# Patient Record
Sex: Female | Born: 1960 | Race: Black or African American | Hispanic: Yes | Marital: Single | Smoking: Former smoker
Health system: Southern US, Community
[De-identification: ages and names within clinical notes are randomized; demographics above are authoritative.]

## PROBLEM LIST (undated history)

## (undated) DIAGNOSIS — E119 Type 2 diabetes mellitus without complications: Secondary | ICD-10-CM

## (undated) DIAGNOSIS — I639 Cerebral infarction, unspecified: Secondary | ICD-10-CM

---

## 2020-07-16 ENCOUNTER — Emergency Department (HOSPITAL_COMMUNITY): Payer: Medicare PPO

## 2020-07-16 ENCOUNTER — Inpatient Hospital Stay (HOSPITAL_COMMUNITY)
Admission: EM | Admit: 2020-07-16 | Discharge: 2020-07-21 | DRG: 637 | Disposition: A | Discharge status: Home / Self Care | Payer: Medicare PPO | Attending: Internal Medicine | Admitting: Internal Medicine

## 2020-07-16 ENCOUNTER — Encounter (HOSPITAL_COMMUNITY): Payer: Self-pay

## 2020-07-16 DIAGNOSIS — E1110 Type 2 diabetes mellitus with ketoacidosis without coma: Secondary | ICD-10-CM | POA: Diagnosis not present

## 2020-07-16 DIAGNOSIS — I129 Hypertensive chronic kidney disease with stage 1 through stage 4 chronic kidney disease, or unspecified chronic kidney disease: Secondary | ICD-10-CM | POA: Diagnosis present

## 2020-07-16 DIAGNOSIS — N1832 Chronic kidney disease, stage 3b: Secondary | ICD-10-CM | POA: Diagnosis present

## 2020-07-16 DIAGNOSIS — K5900 Constipation, unspecified: Secondary | ICD-10-CM | POA: Diagnosis present

## 2020-07-16 DIAGNOSIS — Z20822 Contact with and (suspected) exposure to covid-19: Secondary | ICD-10-CM | POA: Diagnosis present

## 2020-07-16 DIAGNOSIS — E1122 Type 2 diabetes mellitus with diabetic chronic kidney disease: Secondary | ICD-10-CM | POA: Diagnosis present

## 2020-07-16 DIAGNOSIS — E871 Hypo-osmolality and hyponatremia: Secondary | ICD-10-CM | POA: Diagnosis present

## 2020-07-16 DIAGNOSIS — Z888 Allergy status to other drugs, medicaments and biological substances status: Secondary | ICD-10-CM

## 2020-07-16 DIAGNOSIS — E1100 Type 2 diabetes mellitus with hyperosmolarity without nonketotic hyperglycemic-hyperosmolar coma (NKHHC): Secondary | ICD-10-CM | POA: Diagnosis present

## 2020-07-16 DIAGNOSIS — E876 Hypokalemia: Secondary | ICD-10-CM | POA: Diagnosis present

## 2020-07-16 DIAGNOSIS — Z8673 Personal history of transient ischemic attack (TIA), and cerebral infarction without residual deficits: Secondary | ICD-10-CM | POA: Diagnosis not present

## 2020-07-16 DIAGNOSIS — G9341 Metabolic encephalopathy: Secondary | ICD-10-CM | POA: Diagnosis present

## 2020-07-16 DIAGNOSIS — Z885 Allergy status to narcotic agent status: Secondary | ICD-10-CM

## 2020-07-16 DIAGNOSIS — Z79899 Other long term (current) drug therapy: Secondary | ICD-10-CM | POA: Diagnosis not present

## 2020-07-16 DIAGNOSIS — N179 Acute kidney failure, unspecified: Secondary | ICD-10-CM | POA: Diagnosis present

## 2020-07-16 DIAGNOSIS — R4781 Slurred speech: Secondary | ICD-10-CM | POA: Diagnosis present

## 2020-07-16 DIAGNOSIS — Z7984 Long term (current) use of oral hypoglycemic drugs: Secondary | ICD-10-CM | POA: Diagnosis not present

## 2020-07-16 DIAGNOSIS — E872 Acidosis, unspecified: Secondary | ICD-10-CM | POA: Diagnosis present

## 2020-07-16 DIAGNOSIS — Z794 Long term (current) use of insulin: Secondary | ICD-10-CM | POA: Diagnosis not present

## 2020-07-16 DIAGNOSIS — E860 Dehydration: Secondary | ICD-10-CM | POA: Diagnosis present

## 2020-07-16 DIAGNOSIS — E1165 Type 2 diabetes mellitus with hyperglycemia: Secondary | ICD-10-CM

## 2020-07-16 DIAGNOSIS — Z9114 Patient's other noncompliance with medication regimen: Secondary | ICD-10-CM | POA: Diagnosis not present

## 2020-07-16 DIAGNOSIS — Z880 Allergy status to penicillin: Secondary | ICD-10-CM

## 2020-07-16 HISTORY — DX: Type 2 diabetes mellitus without complications: E11.9

## 2020-07-16 HISTORY — DX: Cerebral infarction, unspecified: I63.9

## 2020-07-16 LAB — COMPREHENSIVE METABOLIC PANEL
ALT: 13 U/L (ref 0–44)
AST: 19 U/L (ref 15–41)
Albumin: 3.8 g/dL (ref 3.5–5.0)
Alkaline Phosphatase: 67 U/L (ref 38–126)
Anion gap: 20 — ABNORMAL HIGH (ref 5–15)
BUN: 60 mg/dL — ABNORMAL HIGH (ref 6–20)
CO2: 21 mmol/L — ABNORMAL LOW (ref 22–32)
Calcium: 9.4 mg/dL (ref 8.9–10.3)
Chloride: 79 mmol/L — ABNORMAL LOW (ref 98–111)
Creatinine, Ser: 2.87 mg/dL — ABNORMAL HIGH (ref 0.44–1.00)
GFR, Estimated: 18 mL/min — ABNORMAL LOW (ref >60)
Glucose, Bld: 901 mg/dL (ref 70–99)
Potassium: 4.1 mmol/L (ref 3.5–5.1)
Sodium: 120 mmol/L — ABNORMAL LOW (ref 135–145)
Total Bilirubin: 1.6 mg/dL — ABNORMAL HIGH (ref 0.3–1.2)
Total Protein: 7.5 g/dL (ref 6.5–8.1)

## 2020-07-16 LAB — CBG MONITORING, ED
Glucose-Capillary: 186 mg/dL — ABNORMAL HIGH (ref 70–99)
Glucose-Capillary: 209 mg/dL — ABNORMAL HIGH (ref 70–99)
Glucose-Capillary: 225 mg/dL — ABNORMAL HIGH (ref 70–99)
Glucose-Capillary: 271 mg/dL — ABNORMAL HIGH (ref 70–99)
Glucose-Capillary: 340 mg/dL — ABNORMAL HIGH (ref 70–99)
Glucose-Capillary: 435 mg/dL — ABNORMAL HIGH (ref 70–99)
Glucose-Capillary: 453 mg/dL — ABNORMAL HIGH (ref 70–99)
Glucose-Capillary: >600 mg/dL (ref 70–99)
Glucose-Capillary: >600 mg/dL (ref 70–99)

## 2020-07-16 LAB — BASIC METABOLIC PANEL
Anion gap: 21 — ABNORMAL HIGH (ref 5–15)
Anion gap: 15 (ref 5–15)
Anion gap: 14 (ref 5–15)
BUN: 57 mg/dL — ABNORMAL HIGH (ref 6–20)
BUN: 51 mg/dL — ABNORMAL HIGH (ref 6–20)
BUN: 47 mg/dL — ABNORMAL HIGH (ref 6–20)
CO2: 21 mmol/L — ABNORMAL LOW (ref 22–32)
CO2: 23 mmol/L (ref 22–32)
CO2: 22 mmol/L (ref 22–32)
Calcium: 9.4 mg/dL (ref 8.9–10.3)
Calcium: 9.5 mg/dL (ref 8.9–10.3)
Calcium: 9.3 mg/dL (ref 8.9–10.3)
Chloride: 74 mmol/L — ABNORMAL LOW (ref 98–111)
Chloride: 89 mmol/L — ABNORMAL LOW (ref 98–111)
Chloride: 89 mmol/L — ABNORMAL LOW (ref 98–111)
Creatinine, Ser: 3 mg/dL — ABNORMAL HIGH (ref 0.44–1.00)
Creatinine, Ser: 2.19 mg/dL — ABNORMAL HIGH (ref 0.44–1.00)
Creatinine, Ser: 1.73 mg/dL — ABNORMAL HIGH (ref 0.44–1.00)
GFR, Estimated: 17 mL/min — ABNORMAL LOW (ref >60)
GFR, Estimated: 25 mL/min — ABNORMAL LOW (ref >60)
GFR, Estimated: 33 mL/min — ABNORMAL LOW (ref >60)
Glucose, Bld: 1010 mg/dL (ref 70–99)
Glucose, Bld: 490 mg/dL — ABNORMAL HIGH (ref 70–99)
Glucose, Bld: 244 mg/dL — ABNORMAL HIGH (ref 70–99)
Potassium: 4.6 mmol/L (ref 3.5–5.1)
Potassium: 4 mmol/L (ref 3.5–5.1)
Potassium: 3.1 mmol/L — ABNORMAL LOW (ref 3.5–5.1)
Sodium: 116 mmol/L — CL (ref 135–145)
Sodium: 127 mmol/L — ABNORMAL LOW (ref 135–145)
Sodium: 125 mmol/L — ABNORMAL LOW (ref 135–145)

## 2020-07-16 LAB — I-STAT VENOUS BLOOD GAS, ED
Acid-base deficit: 3 mmol/L — ABNORMAL HIGH (ref 0.0–2.0)
Bicarbonate: 23.1 mmol/L (ref 20.0–28.0)
Calcium, Ion: 1.07 mmol/L — ABNORMAL LOW (ref 1.15–1.40)
HCT: 44 % (ref 36.0–46.0)
Hemoglobin: 15 g/dL (ref 12.0–15.0)
O2 Saturation: 67 %
Potassium: 4.6 mmol/L (ref 3.5–5.1)
Sodium: 117 mmol/L — CL (ref 135–145)
TCO2: 24 mmol/L (ref 22–32)
pCO2, Ven: 42.3 mmHg — ABNORMAL LOW (ref 44.0–60.0)
pH, Ven: 7.344 (ref 7.250–7.430)
pO2, Ven: 37 mmHg (ref 32.0–45.0)

## 2020-07-16 LAB — LIPASE, BLOOD: Lipase: 132 U/L — ABNORMAL HIGH (ref 11–51)

## 2020-07-16 LAB — OSMOLALITY: Osmolality: 342 mOsm/kg (ref 275–295)

## 2020-07-16 LAB — RESP PANEL BY RT-PCR (FLU A&B, COVID) ARPGX2
Influenza A by PCR: NEGATIVE
Influenza B by PCR: NEGATIVE
SARS Coronavirus 2 by RT PCR: NEGATIVE

## 2020-07-16 LAB — CBC
HCT: 40.5 % (ref 36.0–46.0)
Hemoglobin: 13.3 g/dL (ref 12.0–15.0)
MCH: 28.3 pg (ref 26.0–34.0)
MCHC: 32.8 g/dL (ref 30.0–36.0)
MCV: 86.2 fL (ref 80.0–100.0)
Platelets: 248 10*3/uL (ref 150–400)
RBC: 4.7 MIL/uL (ref 3.87–5.11)
RDW: 11.6 % (ref 11.5–15.5)
WBC: 8.2 10*3/uL (ref 4.0–10.5)
nRBC: 0 % (ref 0.0–0.2)

## 2020-07-16 LAB — URINALYSIS, ROUTINE W REFLEX MICROSCOPIC
Bilirubin Urine: NEGATIVE
Glucose, UA: >=500 mg/dL — AB
Hgb urine dipstick: NEGATIVE
Ketones, ur: 5 mg/dL — AB
Leukocytes,Ua: NEGATIVE
Nitrite: NEGATIVE
Protein, ur: NEGATIVE mg/dL
Specific Gravity, Urine: 1.02 (ref 1.005–1.030)
pH: 5 (ref 5.0–8.0)

## 2020-07-16 LAB — BETA-HYDROXYBUTYRIC ACID: Beta-Hydroxybutyric Acid: 4.7 mmol/L — ABNORMAL HIGH (ref 0.05–0.27)

## 2020-07-16 LAB — MAGNESIUM: Magnesium: 2.9 mg/dL — ABNORMAL HIGH (ref 1.7–2.4)

## 2020-07-16 MED ORDER — TOPIRAMATE 25 MG PO TABS
100.0000 mg | ORAL_TABLET | Freq: Two times a day (BID) | ORAL | Status: DC
  Administered 2020-07-16 – 2020-07-21 (×10): 100 mg via ORAL
  Filled 2020-07-16 (×10): qty 4

## 2020-07-16 MED ORDER — LACTATED RINGERS IV BOLUS
20.0000 mL/kg | Freq: Once | INTRAVENOUS | Status: AC
  Administered 2020-07-16: 1000 mL via INTRAVENOUS

## 2020-07-16 MED ORDER — METOCLOPRAMIDE HCL 5 MG/ML IJ SOLN
10.0000 mg | Freq: Once | INTRAMUSCULAR | Status: AC
  Administered 2020-07-16: 10 mg via INTRAVENOUS
  Filled 2020-07-16: qty 2

## 2020-07-16 MED ORDER — MONTELUKAST SODIUM 10 MG PO TABS
10.0000 mg | ORAL_TABLET | Freq: Every day | ORAL | Status: DC
  Administered 2020-07-16 – 2020-07-20 (×5): 10 mg via ORAL
  Filled 2020-07-16 (×5): qty 1

## 2020-07-16 MED ORDER — DICYCLOMINE HCL 10 MG PO CAPS
10.0000 mg | ORAL_CAPSULE | Freq: Four times a day (QID) | ORAL | Status: DC
  Administered 2020-07-16 – 2020-07-21 (×16): 10 mg via ORAL
  Filled 2020-07-16 (×16): qty 1

## 2020-07-16 MED ORDER — ACETAMINOPHEN 325 MG PO TABS
650.0000 mg | ORAL_TABLET | Freq: Four times a day (QID) | ORAL | Status: DC | PRN
  Administered 2020-07-17 – 2020-07-20 (×4): 650 mg via ORAL
  Filled 2020-07-16 (×4): qty 2

## 2020-07-16 MED ORDER — ACETAMINOPHEN 650 MG RE SUPP: 650.0000 mg | Freq: Four times a day (QID) | RECTAL | Status: DC | PRN

## 2020-07-16 MED ORDER — ALBUTEROL SULFATE (2.5 MG/3ML) 0.083% IN NEBU: 2.5000 mg | INHALATION_SOLUTION | Freq: Four times a day (QID) | RESPIRATORY_TRACT | Status: DC | PRN

## 2020-07-16 MED ORDER — INSULIN REGULAR(HUMAN) IN NACL 100-0.9 UT/100ML-% IV SOLN
INTRAVENOUS | Status: DC
Start: 2020-07-16 — End: 2020-07-17
  Administered 2020-07-16: 9 [IU]/h via INTRAVENOUS
  Administered 2020-07-17: 4.4 [IU]/h via INTRAVENOUS
  Filled 2020-07-16 (×2): qty 100

## 2020-07-16 MED ORDER — ONDANSETRON HCL 4 MG/2ML IJ SOLN
4.0000 mg | Freq: Four times a day (QID) | INTRAMUSCULAR | Status: DC | PRN
  Administered 2020-07-16 – 2020-07-18 (×2): 4 mg via INTRAVENOUS
  Filled 2020-07-16 (×2): qty 2

## 2020-07-16 MED ORDER — LACTATED RINGERS IV SOLN: INTRAVENOUS | Status: DC

## 2020-07-16 MED ORDER — METOCLOPRAMIDE HCL 5 MG PO TABS
10.0000 mg | ORAL_TABLET | Freq: Three times a day (TID) | ORAL | Status: DC | PRN
  Administered 2020-07-17 (×2): 10 mg via ORAL
  Filled 2020-07-16 (×2): qty 2

## 2020-07-16 MED ORDER — HEPARIN SODIUM (PORCINE) 5000 UNIT/ML IJ SOLN
5000.0000 [IU] | Freq: Three times a day (TID) | INTRAMUSCULAR | Status: DC
  Administered 2020-07-16 – 2020-07-21 (×12): 5000 [IU] via SUBCUTANEOUS
  Filled 2020-07-16 (×12): qty 1

## 2020-07-16 MED ORDER — SODIUM CHLORIDE 0.9% FLUSH
3.0000 mL | Freq: Two times a day (BID) | INTRAVENOUS | Status: DC
  Administered 2020-07-17 – 2020-07-21 (×8): 3 mL via INTRAVENOUS

## 2020-07-16 MED ORDER — POTASSIUM CHLORIDE 10 MEQ/100ML IV SOLN
10.0000 meq | INTRAVENOUS | Status: AC
  Administered 2020-07-16 (×2): 10 meq via INTRAVENOUS
  Filled 2020-07-16 (×2): qty 100

## 2020-07-16 MED ORDER — PANTOPRAZOLE SODIUM 40 MG IV SOLR
40.0000 mg | INTRAVENOUS | Status: DC
  Administered 2020-07-16 – 2020-07-18 (×3): 40 mg via INTRAVENOUS
  Filled 2020-07-16 (×3): qty 40

## 2020-07-16 MED ORDER — ROSUVASTATIN CALCIUM 20 MG PO TABS
20.0000 mg | ORAL_TABLET | Freq: Every day | ORAL | Status: DC
  Administered 2020-07-16 – 2020-07-20 (×5): 20 mg via ORAL
  Filled 2020-07-16 (×5): qty 1

## 2020-07-16 MED ORDER — FLUTICASONE PROPIONATE 50 MCG/ACT NA SUSP
1.0000 | Freq: Every day | NASAL | Status: DC
  Administered 2020-07-17 – 2020-07-21 (×5): 1 via NASAL
  Filled 2020-07-16: qty 16

## 2020-07-16 MED ORDER — DEXTROSE 50 % IV SOLN
0.0000 mL | INTRAVENOUS | Status: DC | PRN
  Filled 2020-07-16: qty 50

## 2020-07-16 MED ORDER — DEXTROSE IN LACTATED RINGERS 5 % IV SOLN: INTRAVENOUS | Status: DC

## 2020-07-16 NOTE — H&P (Addendum)
History and Physical    Beth Wilkinson QIH:[id number] DOB: April 23, 1960 DOA: 07/16/2020  Referring MD/NP/PA: Tilden Fossa, MD PCP: Pcp, No  Patient coming from: via EMS  Chief Complaint: Vomiting  I have personally briefly reviewed patient's old medical records in [facility]   HPI: Beth Wilkinson is a 60 y.o. female with medical history significant of diabetes mellitus type 2 and CVA who presents with complaints of vomiting.  She lives in [place], [place] and has been visiting a friend here in [place] for the last 3 weeks.  Over the last 3 days however she reports that she has had difficulty controlling her blood sugars.  She had ran out of some of her short acting insulin and she was unable to keep down some of her oral medications.  Reports associated symptoms of headache and nausea.  Emesis has been nonbloody in appearance. Her niece makes note the patient has issues with compliance with medications in the past and has previously gone missing for peers in time for which family does know where she has gone.  She previously has had a stroke previously in the past with complaints of slurred speech, but patient reports that those symptoms have resolved.  ED Course: Upon admission into the emergency department patient was seen to be afebrile, pulse 92-1 06, blood pressure 78/51-130 2/86 after IV fluids, and all other vital signs maintained.  Chest x-ray showed no acute abnormalities.   Labs initially significant for sodium 120, chloride 79, CO2 21, glucose 901, BUN 60, creatinine 2.87, glucose 901, anion gap 20, lipase 132, and total bilirubin 1.6.  CT scan of the brain, abdomen, and pelvis did not note any acute abnormalities repeat labs revealed sodium 116, CO2 21, BUN 57, creatinine 3,  glucose 1010, and anion gap 21.  Patient had been started on insulin drip per protocol with bolus of lactated Ringer's, given 20 mg of potassium chloride IV, Zofran, and Reglan 10 mg IV.  TRH called to  admit.  Review of Systems  Constitutional:  Positive for malaise/fatigue. Negative for fever.  Eyes:  Negative for photophobia and pain.  Respiratory:  Negative for cough and shortness of breath.   Cardiovascular:  Negative for chest pain and leg swelling.  Gastrointestinal:  Positive for abdominal pain, nausea and vomiting.  Genitourinary:  Negative for dysuria and flank pain.  Musculoskeletal:  Negative for joint pain and myalgias.  Neurological:  Positive for speech change. Negative for loss of consciousness.  Psychiatric/Behavioral:  Negative for substance abuse.    Past Medical History:  Diagnosis Date   Diabetes mellitus type 2 in nonobese [facility])     History reviewed. No pertinent surgical history.   has no history on file for tobacco use, alcohol use, and drug use.  Allergies  Allergen Reactions   Codeine Other (See Comments)    hallucinations   Penicillins Other (See Comments)   Pregabalin     No family history on file.  Prior to Admission medications   Medication Sig Start Date End Date Taking? Authorizing Provider  dicyclomine (BENTYL) 10 MG capsule Take 10 mg by mouth 4 (four) times daily. 02/20/20  Yes [provider]  fluticasone (FLONASE) 50 MCG/ACT nasal spray Place 1 spray into both nostrils daily. 04/15/20  Yes [provider]  gabapentin (NEURONTIN) 100 MG capsule Take 100 mg by mouth 3 (three) times daily. 05/30/20  Yes [provider]  glimepiride (AMARYL) 2 MG tablet Take 2 mg by mouth every morning. 06/17/20  Yes  [provider]  hydrOXYzine (VISTARIL) 25 MG capsule Take 25-50 mg by mouth at bedtime. 03/23/20  Yes [provider]  Insulin Lispro Prot & Lispro (HUMALOG 75/25 MIX) (75-25) 100 UNIT/ML Kwikpen Inject 15 Units into the skin daily.   Yes [provider]  JANUVIA 100 MG tablet Take 100 mg by mouth daily. 04/08/20  Yes [provider]  LANTUS 100 UNIT/ML injection Inject 40 Units into the skin  2 (two) times daily. 05/30/20  Yes [provider]  losartan-hydrochlorothiazide (HYZAAR) 100-25 MG tablet Take 1 tablet by mouth daily. 07/05/20  Yes [provider]  metoCLOPramide (REGLAN) 10 MG tablet Take 10 mg by mouth 3 (three) times daily as needed for nausea or vomiting. 05/30/20  Yes [provider]  metoprolol tartrate (LOPRESSOR) 25 MG tablet Take 25 mg by mouth 2 (two) times daily. 04/20/20  Yes [provider]  mirtazapine (REMERON SOL-TAB) 30 MG disintegrating tablet Take 30 mg by mouth at bedtime. 04/09/20  Yes [provider]  montelukast (SINGULAIR) 10 MG tablet Take 10 mg by mouth at bedtime. 06/17/20  Yes [provider]  nitroGLYCERIN (NITROSTAT) 0.4 MG SL tablet Place 0.4 mg under the tongue every 5 (five) minutes as needed for chest pain.   Yes [provider]  omeprazole (PRILOSEC) 40 MG capsule Take 40 mg by mouth daily. 02/19/20  Yes [provider]  pioglitazone (ACTOS) 30 MG tablet Take 30 mg by mouth daily. 06/17/20  Yes [provider]  PROAIR RESPICLICK 108 (90 Base) MCG/ACT AEPB Inhale 2 puffs into the lungs every 6 (six) hours as needed for wheezing or shortness of breath. 05/30/20  Yes [provider]  rosuvastatin (CRESTOR) 20 MG tablet Take 20 mg by mouth at bedtime. 07/05/20  Yes [provider]  topiramate (TOPAMAX) 100 MG tablet Take 100 mg by mouth 2 (two) times daily. 05/04/20  Yes [provider]    Physical Exam:  Constitutional: Elderly female NAD, calm, comfortable Vitals:   07/16/20 1239 07/16/20 1245 07/16/20 1300 07/16/20 1315  BP:  126/74 114/62 124/80  Pulse:  (!) 106 92 95  Resp:  15 19 17   Temp:      SpO2:  100% 99% 100%  Weight: 70.8 kg     Height: 5\' 4"  (1.626 m)      Eyes: PERRL, lids and conjunctivae normal ENMT: Mucous membranes are dry. Posterior pharynx clear of any exudate or lesions.  Neck: normal, supple, no masses, no  thyromegaly Respiratory: clear to auscultation bilaterally, no wheezing, no crackles. Normal respiratory effort. No accessory muscle use.  Cardiovascular: Tachycardic, no murmurs / rubs / gallops. No extremity edema. 2+ pedal pulses. No carotid bruits.  Abdomen: no tenderness, no masses palpated. No hepatosplenomegaly. Bowel sounds positive.  Musculoskeletal: no clubbing / cyanosis. No joint deformity upper and lower extremities. Good ROM, no contractures. Normal muscle tone.  Skin: no rashes, lesions, ulcers. No induration.  Poor skin turgor.   Neurologic: CN 2-12 grossly intact.  Slurred speech.  Patient able to move all extremities. Psychiatric:  Alert and oriented x 3. Normal mood.     Labs on Admission: I have personally reviewed following labs and imaging studies  CBC: Recent Labs  Lab 07/16/20 0909 07/16/20 1238  WBC 8.2  --   HGB 13.3 15.0  HCT 40.5 44.0  MCV 86.2  --   PLT 248  --    Basic Metabolic Panel: Recent Labs  Lab 07/16/20 0909 07/16/20 1231 07/16/20  1238  NA 120* 116* 117*  K 4.1 4.6 4.6  CL 79* 74*  --   CO2 21* 21*  --   GLUCOSE 901* 1,010*  --   BUN 60* 57*  --   CREATININE 2.87* 3.00*  --   CALCIUM 9.4 9.4  --   MG  --  2.9*  --    GFR: Estimated Creatinine Clearance: 19.2 mL/min (A) (by C-G formula based on SCr of 3 mg/dL (H)). Liver Function Tests: Recent Labs  Lab 07/16/20 0909  AST 19  ALT 13  ALKPHOS 67  BILITOT 1.6*  PROT 7.5  ALBUMIN 3.8   Recent Labs  Lab 07/16/20 0909  LIPASE 132*   No results for input(s): AMMONIA in the last 168 hours. Coagulation Profile: No results for input(s): INR, PROTIME in the last 168 hours. Cardiac Enzymes: No results for input(s): CKTOTAL, CKMB, CKMBINDEX, TROPONINI in the last 168 hours. BNP (last 3 results) No results for input(s): PROBNP in the last 8760 hours. HbA1C: No results for input(s): HGBA1C in the last 72 hours. CBG: Recent Labs  Lab 07/16/20 0913 07/16/20 1423  GLUCAP  >600* >600*   Lipid Profile: No results for input(s): CHOL, HDL, LDLCALC, TRIG, CHOLHDL, LDLDIRECT in the last 72 hours. Thyroid Function Tests: No results for input(s): TSH, T4TOTAL, FREET4, T3FREE, THYROIDAB in the last 72 hours. Anemia Panel: No results for input(s): VITAMINB12, FOLATE, FERRITIN, TIBC, IRON, RETICCTPCT in the last 72 hours. Urine analysis:    Component Value Date/Time   COLORURINE YELLOW 07/16/2020 1300   APPEARANCEUR HAZY (A) 07/16/2020 1300   LABSPEC 1.020 07/16/2020 1300   PHURINE 5.0 07/16/2020 1300   GLUCOSEU >=500 (A) 07/16/2020 1300   HGBUR NEGATIVE 07/16/2020 1300   BILIRUBINUR NEGATIVE 07/16/2020 1300   KETONESUR 5 (A) 07/16/2020 1300   PROTEINUR NEGATIVE 07/16/2020 1300   NITRITE NEGATIVE 07/16/2020 1300   LEUKOCYTESUR NEGATIVE 07/16/2020 1300   Sepsis Labs: Recent Results (from the past 240 hour(s))  Resp Panel by RT-PCR (Flu A&B, Covid) Nasopharyngeal Swab     Status: None   Collection Time: 07/16/20 12:27 PM   Specimen: Nasopharyngeal Swab; Nasopharyngeal(NP) swabs in vial transport medium  Result Value Ref Range Status   SARS Coronavirus 2 by RT PCR NEGATIVE NEGATIVE Final    Comment: (NOTE) SARS-CoV-2 target nucleic acids are NOT DETECTED.  The SARS-CoV-2 RNA is generally detectable in upper respiratory specimens during the acute phase of infection. The lowest concentration of SARS-CoV-2 viral copies this assay can detect is 138 copies/mL. A negative result does not preclude SARS-Cov-2 infection and should not be used as the sole basis for treatment or other patient management decisions. A negative result may occur with  improper specimen collection/handling, submission of specimen other than nasopharyngeal swab, presence of viral mutation(s) within the areas targeted by this assay, and inadequate number of viral copies(<138 copies/mL). A negative result must be combined with clinical observations, patient history, and  epidemiological information. The expected result is Negative.  Fact Sheet for Patients:  BloggerCourse.com  Fact Sheet for Healthcare Providers:  SeriousBroker.it  This test is no t yet approved or cleared by the [place] FDA and  has been authorized for detection and/or diagnosis of SARS-CoV-2 by FDA under an Emergency Use Authorization (EUA). This EUA will remain  in effect (meaning this test can be used) for the duration of the COVID-19 declaration under Section 564(b)(1) of the Act, 21 U.S.C.section 360bbb-3(b)(1), unless the authorization is terminated  or revoked sooner.  Influenza A by PCR NEGATIVE NEGATIVE Final   Influenza B by PCR NEGATIVE NEGATIVE Final    Comment: (NOTE) The Xpert Xpress SARS-CoV-2/FLU/RSV plus assay is intended as an aid in the diagnosis of influenza from Nasopharyngeal swab specimens and should not be used as a sole basis for treatment. Nasal washings and aspirates are unacceptable for Xpert Xpress SARS-CoV-2/FLU/RSV testing.  Fact Sheet for Patients: BloggerCourse.com  Fact Sheet for Healthcare Providers: SeriousBroker.it  This test is not yet approved or cleared by the [place] FDA and has been authorized for detection and/or diagnosis of SARS-CoV-2 by FDA under an Emergency Use Authorization (EUA). This EUA will remain in effect (meaning this test can be used) for the duration of the COVID-19 declaration under Section 564(b)(1) of the Act, 21 U.S.C. section 360bbb-3(b)(1), unless the authorization is terminated or revoked.  Performed at [facility], [address] [place], [place] [address]      Radiological Exams on Admission: CT Abdomen Pelvis Wo Contrast  Result Date: 07/16/2020 CLINICAL DATA:  Midline abdominal pain and nausea free sugar blood glucose greater than 1,000 EXAM: CT ABDOMEN AND PELVIS WITHOUT  CONTRAST TECHNIQUE: Multidetector CT imaging of the abdomen and pelvis was performed following the standard protocol without IV contrast. COMPARISON:  None. FINDINGS: Lower chest: Hypoventilatory change in the dependent lungs. No acute abnormality. Hepatobiliary: Unremarkable noncontrast appearance of the hepatic parenchyma. Gallbladder is unremarkable. No biliary ductal dilation. Pancreas: The pancreatic parenchyma is within normal noncontrast appearance. Spleen: Within normal limits. Adrenals/Urinary Tract: Thickening of the bilateral adrenal glands without discrete nodularity, favored represent hyperplasia. No hydronephrosis. No renal, ureteral or bladder calculi visualized. Homogeneously hypodense 3 cm right lower pole renal lesion with Hounsfield units approaching water density, likely representing a cyst. Additional right upper pole hypodense renal lesion which is too small to accurately characterize but statistically likely to represent a cyst. Urinary bladder is unremarkable. Stomach/Bowel: Stomach is unremarkable for degree of distension. No pathologic dilation of small bowel. The appendix and terminal ileum are grossly unremarkable. Hyperdense material throughout the ascending and transverse colon, likely representing ingested material such as anti diuretic. No evidence of acute colonic inflammation. Vascular/Lymphatic: Aortic atherosclerosis without aneurysmal dilation. No pathologically enlarged abdominal or pelvic lymph nodes. Reproductive: Uterus and bilateral adnexa are unremarkable. Other: No abdominopelvic ascites. Musculoskeletal: Multilevel degenerative changes spine. No aggressive lytic or blastic lesion of bone. IMPRESSION: 1. No acute findings in the abdomen or pelvis. 2.  Aortic Atherosclerosis (ICD10-I70.0). Electronically Signed   By: Maudry Mayhew MD   On: 07/16/2020 14:39   CT Head Wo Contrast  Result Date: 07/16/2020 CLINICAL DATA:  Left frontal headache. EXAM: CT HEAD WITHOUT  CONTRAST TECHNIQUE: Contiguous axial images were obtained from the base of the skull through the vertex without intravenous contrast. COMPARISON:  None. FINDINGS: Brain: No evidence of acute infarction, hemorrhage, hydrocephalus, extra-axial collection or mass lesion/mass effect. Mild generalized cerebral atrophy. Scattered mild subcortical white matter hypodensities are nonspecific, but favored to reflect chronic microvascular ischemic changes. Vascular: Atherosclerotic vascular calcification of the carotid siphons. No hyperdense vessel. Skull: Normal. Negative for fracture or focal lesion. Sinuses/Orbits: No acute finding. Other: None. IMPRESSION: 1. No acute intracranial abnormality. 2. Mild cerebral atrophy and chronic microvascular ischemic changes. Electronically Signed   By: Obie Dredge M.D.   On: 07/16/2020 14:33   DG Chest Portable 1 View  Result Date: 07/16/2020 CLINICAL DATA:  Onset vomiting last night. EXAM: PORTABLE CHEST 1 VIEW COMPARISON:  None. FINDINGS: Lungs clear. Heart  size normal. No pneumothorax or pleural fluid. No acute or focal bony abnormality. IMPRESSION: Negative chest. Electronically Signed   By: Drusilla Kanner M.D.   On: 07/16/2020 12:28    EKG: Independently reviewed.  Sinus rhythm at 93 bpm with right atrial enlargement  Assessment/Plan Hyperosmolar hyperglycemic state: Acute.  Patient presents with glucose elevated up to 1010 with CO2 21, anion gap of 21.  Venous blood gas was within normal limits at 7.344.  Sodium levels were noted to be as low as 116, but when corrected for hyperglycemia noted to be within normal limits at 139.  Urinalysis did note some mild ketones but -Admit to a progressive bed -Hyperglycemia order set utilized -Continue lactated Ringer's IV fluids with insulin drip -Serial BMPs and replace electrolytes as needed -Transition to subcu insulin once blood sugars less than 250 -Antiemetics as needed  Acute metabolic encephalopathy and slurred  speech: Patient noted to have significant slurred speech on exam with concern for confusion, but denies any focal weakness.  CT scan of the brain showed no acute abnormalities. -Neurochecks -Check UDS -Consider need of MRI if symptoms do not improve with correction of blood sugar  Epigastric pain with nausea and vomiting: CT scan of the abdomen pelvis showed no acute abnormalities.  Urinalysis was negative for any signs of infection.  Suspect secondary to HHS -Aspiration precautions with elevation of the head of the bed -Protonix IV -Antiemetics as needed  Acute kidney injury: On admission patient presented with creatinine elevated up to 3 with BUN 57.  Patient is still able to urinate and urinalysis did not show significant signs of infection.  The elevated BUN to creatinine ratio gives concern for a prerenal cause of symptoms.  No baseline creatinine available at this time. -Strict intake and output -Continue lactated ringer IV fluids  Metabolic acidosis with elevated anion gap: On admission patient was noted to have CO2 of 21 with elevated anion gap of up to 21. -Continue to monitor  History of CVA: Patient with a prior history of stroke previously in the past with slurred speech, but reports that her speech was not normally slurred like it is today.  DVT prophylaxis: Heparin Code Status: Full Family Communication: Niece updated over the phone Disposition Plan: Hopefully discharge home once medically stable Consults called: None Admission status: Inpatient, require more than 2 midnight stay for need of IV fluid in the setting of AKI and at bedtime  Clydie Braun MD [facility]   If 7PM-7AM, please contact night-coverage   07/16/2020, 4:30 PM

## 2020-07-16 NOTE — ED Notes (Signed)
Pt reports feeling very nauseated - PRN zofran given

## 2020-07-16 NOTE — ED Notes (Signed)
cbg 225  

## 2020-07-16 NOTE — ED Provider Notes (Signed)
[facility]   CSN: [id number] Arrival date & time: 07/16/20  0840     History No chief complaint on file.   Beth Wilkinson is a 60 y.o. female.  The history is provided by the patient.  Beth Wilkinson is a 60 y.o. female who presents to the Emergency Department complaining of vomiting.  She presents to the ED complaining of vomiting since last night.  Sxs are severe and constant in nature.  She states she has been unable to take her diabetic medications due persistent vomiting.  Has associated abdominal discomfort.  Reports mild waxing and waning frontal HA for the last several days.  No fever, chest pain, sob, dysuria, diarrhea. No prior similar symptoms. She is visiting from out of town and has been here for the last three weeks.    History reviewed. No pertinent past medical history.  Patient Active Problem List   Diagnosis Date Noted   Hyperosmolar hyperglycemic state (HHS) (HCC) 07/16/2020    History reviewed. No pertinent surgical history.   OB History   No obstetric history on file.     No family history on file.     Home Medications Prior to Admission medications   Medication Sig Start Date End Date Taking? Authorizing Provider  gabapentin (NEURONTIN) 100 MG capsule Take 100 mg by mouth 3 (three) times daily. 05/30/20  Yes [provider]  glimepiride (AMARYL) 2 MG tablet Take 2 mg by mouth every morning. 06/17/20  Yes [provider]  hydrOXYzine (VISTARIL) 25 MG capsule Take 25-50 mg by mouth at bedtime. 03/23/20  Yes [provider]  LANTUS 100 UNIT/ML injection Inject 40 Units into the skin 2 (two) times daily. 05/30/20  Yes [provider]  losartan-hydrochlorothiazide (HYZAAR) 100-25 MG tablet Take 1 tablet by mouth daily. 07/05/20  Yes [provider]  metoCLOPramide (REGLAN) 10 MG tablet Take 10 mg by mouth 3 (three) times daily as needed for nausea or vomiting.  05/30/20  Yes [provider]  metoprolol tartrate (LOPRESSOR) 25 MG tablet Take 25 mg by mouth 2 (two) times daily. 04/20/20  Yes [provider]  montelukast (SINGULAIR) 10 MG tablet Take 10 mg by mouth at bedtime. 06/17/20  Yes [provider]  pioglitazone (ACTOS) 30 MG tablet Take 30 mg by mouth daily. 06/17/20  Yes [provider]  PROAIR RESPICLICK 108 (90 Base) MCG/ACT AEPB Inhale 2 puffs into the lungs every 6 (six) hours as needed for wheezing or shortness of breath. 05/30/20  Yes [provider]  rosuvastatin (CRESTOR) 20 MG tablet Take 20 mg by mouth at bedtime. 07/05/20  Yes [provider]  topiramate (TOPAMAX) 100 MG tablet Take 100 mg by mouth 2 (two) times daily. 05/04/20  Yes [provider]  dicyclomine (BENTYL) 10 MG capsule Take 10 mg by mouth 4 (four) times daily. 02/20/20   [provider]  fluticasone (FLONASE) 50 MCG/ACT nasal spray Place 1 spray into both nostrils daily. 04/15/20   [provider]  Insulin Lispro Prot & Lispro (HUMALOG MIX 75/25 KWIKPEN) (75-25) 100 UNIT/ML Kwikpen Inject 15 Units into the skin daily.    [provider]  JANUVIA 100 MG tablet Take 100 mg by mouth daily. 04/08/20   [provider]  mirtazapine (REMERON SOL-TAB) 30 MG disintegrating tablet Take 30 mg by mouth at bedtime. 04/09/20   [provider]  nitroGLYCERIN (NITROSTAT) 0.4 MG SL tablet Nitrostat 0.4 mg sublingual tablet  [provider]  omeprazole (PRILOSEC) 40 MG capsule Take 40 mg by mouth daily. 02/19/20   [provider]    Allergies    Codeine, Penicillins, and Pregabalin  Review of Systems   Review of Systems  All other systems reviewed and are negative.  Physical Exam Updated Vital Signs BP 124/80   Pulse 95   Temp 97.6 F (36.4 C)   Resp 17   Ht 5\' 4"  (1.626 m)   Wt 70.8 kg   SpO2 100%   BMI 26.78 kg/m   Physical Exam Vitals and nursing note reviewed.   Constitutional:      General: She is in acute distress.     Appearance: She is well-developed. She is ill-appearing.  HENT:     Head: Normocephalic and atraumatic.  Cardiovascular:     Rate and Rhythm: Regular rhythm. Tachycardia present.     Heart sounds: No murmur heard. Pulmonary:     Effort: Pulmonary effort is normal. No respiratory distress.     Breath sounds: Normal breath sounds.  Abdominal:     Palpations: Abdomen is soft.     Tenderness: There is no guarding or rebound.     Comments: Mild generalized abdominal tenderness  Musculoskeletal:        General: No tenderness.  Skin:    General: Skin is warm and dry.  Neurological:     Mental Status: She is alert and oriented to person, place, and time.     Comments: Slow speech.  MAE symmetrically  Psychiatric:        Behavior: Behavior normal.    ED Results / Procedures / Treatments   Labs (all labs ordered are listed, but only abnormal results are displayed) Labs Reviewed  LIPASE, BLOOD - Abnormal; Notable for the following components:      Result Value   Lipase 132 (*)    All other components within normal limits  COMPREHENSIVE METABOLIC PANEL - Abnormal; Notable for the following components:   Sodium 120 (*)    Chloride 79 (*)    CO2 21 (*)    Glucose, Bld 901 (*)    BUN 60 (*)    Creatinine, Ser 2.87 (*)    Total Bilirubin 1.6 (*)    GFR, Estimated 18 (*)    Anion gap 20 (*)    All other components within normal limits  URINALYSIS, ROUTINE W REFLEX MICROSCOPIC - Abnormal; Notable for the following components:   APPearance HAZY (*)    Glucose, UA >=500 (*)    Ketones, ur 5 (*)    Bacteria, UA RARE (*)    All other components within normal limits  MAGNESIUM - Abnormal; Notable for the following components:   Magnesium 2.9 (*)    All other components within normal limits  BASIC METABOLIC PANEL - Abnormal; Notable for the following components:   Sodium 116 (*)    Chloride 74 (*)    CO2 21 (*)     Glucose, Bld 1,010 (*)    BUN 57 (*)    Creatinine, Ser 3.00 (*)    GFR, Estimated 17 (*)    Anion gap 21 (*)    All other components within normal limits  OSMOLALITY - Abnormal; Notable for the following components:   Osmolality 342 (*)    All other components within normal limits  BETA-HYDROXYBUTYRIC ACID - Abnormal; Notable for the following components:   Beta-Hydroxybutyric Acid 4.70 (*)    All other components within normal limits  CBG MONITORING, ED - Abnormal; Notable for the following components:   Glucose-Capillary >600 (*)    All other components within normal limits  I-STAT VENOUS BLOOD GAS, ED - Abnormal; Notable for the following components:   pCO2, Ven 42.3 (*)    Acid-base deficit 3.0 (*)    Sodium 117 (*)    Calcium, Ion 1.07 (*)    All other components within normal limits  CBG MONITORING, ED - Abnormal; Notable for the following components:   Glucose-Capillary >600 (*)    All other components within normal limits  RESP PANEL BY RT-PCR (FLU A&B, COVID) [id number]  CBC  BASIC METABOLIC PANEL  BASIC METABOLIC PANEL  BASIC METABOLIC PANEL  BETA-HYDROXYBUTYRIC ACID    EKG EKG Interpretation  Date/Time:  Tuesday July 16 2020 12:06:01 EDT Ventricular Rate:  93 PR Interval:  150 QRS Duration: 75 QT Interval:  385 QTC Calculation: 479 R Axis:   47 Text Interpretation: Sinus rhythm Right atrial enlargement Low voltage, precordial leads Confirmed by Tilden Fossa [phone]) on 07/16/2020 12:29:40 PM  Radiology CT Abdomen Pelvis Wo Contrast  Result Date: 07/16/2020 CLINICAL DATA:  Midline abdominal pain and nausea free sugar blood glucose greater than 1,000 EXAM: CT ABDOMEN AND PELVIS WITHOUT CONTRAST TECHNIQUE: Multidetector CT imaging of the abdomen and pelvis was performed following the standard protocol without IV contrast. COMPARISON:  None. FINDINGS: Lower chest: Hypoventilatory change in the dependent lungs. No acute abnormality. Hepatobiliary: Unremarkable  noncontrast appearance of the hepatic parenchyma. Gallbladder is unremarkable. No biliary ductal dilation. Pancreas: The pancreatic parenchyma is within normal noncontrast appearance. Spleen: Within normal limits. Adrenals/Urinary Tract: Thickening of the bilateral adrenal glands without discrete nodularity, favored represent hyperplasia. No hydronephrosis. No renal, ureteral or bladder calculi visualized. Homogeneously hypodense 3 cm right lower pole renal lesion with Hounsfield units approaching water density, likely representing a cyst. Additional right upper pole hypodense renal lesion which is too small to accurately characterize but statistically likely to represent a cyst. Urinary bladder is unremarkable. Stomach/Bowel: Stomach is unremarkable for degree of distension. No pathologic dilation of small bowel. The appendix and terminal ileum are grossly unremarkable. Hyperdense material throughout the ascending and transverse colon, likely representing ingested material such as anti diuretic. No evidence of acute colonic inflammation. Vascular/Lymphatic: Aortic atherosclerosis without aneurysmal dilation. No pathologically enlarged abdominal or pelvic lymph nodes. Reproductive: Uterus and bilateral adnexa are unremarkable. Other: No abdominopelvic ascites. Musculoskeletal: Multilevel degenerative changes spine. No aggressive lytic or blastic lesion of bone. IMPRESSION: 1. No acute findings in the abdomen or pelvis. 2.  Aortic Atherosclerosis (ICD10-I70.0). Electronically Signed   By: Maudry Mayhew MD   On: 07/16/2020 14:39   CT Head Wo Contrast  Result Date: 07/16/2020 CLINICAL DATA:  Left frontal headache. EXAM: CT HEAD WITHOUT CONTRAST TECHNIQUE: Contiguous axial images were obtained from the base of the skull through the vertex without intravenous contrast. COMPARISON:  None. FINDINGS: Brain: No evidence of acute infarction, hemorrhage, hydrocephalus, extra-axial collection or mass lesion/mass effect.  Mild generalized cerebral atrophy. Scattered mild subcortical white matter hypodensities are nonspecific, but favored to reflect chronic microvascular ischemic changes. Vascular: Atherosclerotic vascular calcification of the carotid siphons. No hyperdense vessel. Skull: Normal. Negative for fracture or focal lesion. Sinuses/Orbits: No acute finding. Other: None. IMPRESSION: 1. No acute intracranial abnormality. 2. Mild cerebral atrophy and chronic microvascular ischemic changes. Electronically Signed   By: Obie Dredge M.D.   On: 07/16/2020 14:33   DG Chest Portable 1 View  Result Date: 07/16/2020 CLINICAL DATA:  Onset vomiting last night. EXAM: PORTABLE CHEST 1 VIEW COMPARISON:  None. FINDINGS: Lungs clear. Heart size normal. No pneumothorax or pleural fluid. No acute or focal bony abnormality. IMPRESSION: Negative chest. Electronically Signed   By: Drusilla Kanner M.D.   On: 07/16/2020 12:28    Procedures Procedures CRITICAL CARE Performed by: Tilden Fossa   Total critical care time: 45 minutes  Critical care time was exclusive of separately billable procedures and treating other patients.  Critical care was necessary to treat or prevent imminent or life-threatening deterioration.  Critical care was time spent personally by me on the following activities: development of treatment plan with patient and/or surrogate as well as nursing, discussions with consultants, evaluation of patient's response to treatment, examination of patient, obtaining history from patient or surrogate, ordering and performing treatments and interventions, ordering and review of laboratory studies, ordering and review of radiographic studies, pulse oximetry and re-evaluation of patient's condition.   Medications Ordered in ED Medications  insulin regular, human (MYXREDLIN) 100 units/ 100 mL infusion (9 Units/hr Intravenous Rate/Dose Verify 07/16/20 1442)  lactated ringers infusion ( Intravenous New Bag/Given 07/16/20  1410)  dextrose 5 % in lactated ringers infusion (has no administration in time range)  dextrose 50 % solution 0-50 mL (has no administration in time range)  lactated ringers bolus 20 mL/kg (0 mL/kg Intravenous Stopped 07/16/20 1409)  potassium chloride 10 mEq in 100 mL IVPB (10 mEq Intravenous New Bag/Given 07/16/20 1414)  metoCLOPramide (REGLAN) injection 10 mg (10 mg Intravenous Given 07/16/20 1410)    ED Course  I have reviewed the triage vital signs and the nursing notes.  Pertinent labs & imaging results that were available during my care of the patient were reviewed by me and considered in my medical decision making (see chart for details).    MDM Rules/Calculators/A&P                          patient with history of diabetes here for evaluation of vomiting since yesterday. She is ill appearing on evaluation and is dehydrated appearing. Labs with significant hyperglycemia. PH is within normal limits. Labs with elevation and creatinine. Patient states no history of renal disease. She was treated with IV fluid hydration and insulin drip for hyperosmolar state and AKI. CT head is negative for acute abnormality. CT abdomen pelvis without evidence of obstruction. On repeat assessment patient's mental status appears improved. Medicine consulted for admission for ongoing treatment. Final Clinical Impression(s) / ED Diagnoses Final diagnoses:  Type 2 diabetes mellitus with hyperosmolar hyperglycemic state (HHS) (HCC)  AKI (acute kidney injury) [facility])    Rx / DC Orders ED Discharge Orders     None        Tilden Fossa, MD 07/16/20 1528

## 2020-07-16 NOTE — ED Triage Notes (Signed)
Patient complains of vomiting that started last night. Reports that her BP a little high because couldn't take medicine due to the vomiting. 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 Patient alert and oriented, diabetic

## 2020-07-17 ENCOUNTER — Encounter (HOSPITAL_COMMUNITY): Payer: Self-pay | Admitting: Internal Medicine

## 2020-07-17 ENCOUNTER — Other Ambulatory Visit: Payer: Self-pay

## 2020-07-17 LAB — CBG MONITORING, ED
Glucose-Capillary: 165 mg/dL — ABNORMAL HIGH (ref 70–99)
Glucose-Capillary: 173 mg/dL — ABNORMAL HIGH (ref 70–99)
Glucose-Capillary: 176 mg/dL — ABNORMAL HIGH (ref 70–99)
Glucose-Capillary: 188 mg/dL — ABNORMAL HIGH (ref 70–99)

## 2020-07-17 LAB — RAPID URINE DRUG SCREEN, HOSP PERFORMED
Amphetamines: NOT DETECTED
Barbiturates: NOT DETECTED
Benzodiazepines: NOT DETECTED
Cocaine: NOT DETECTED
Opiates: NOT DETECTED
Tetrahydrocannabinol: NOT DETECTED

## 2020-07-17 LAB — CBC
HCT: 41.6 % (ref 36.0–46.0)
Hemoglobin: 13.9 g/dL (ref 12.0–15.0)
MCH: 28.1 pg (ref 26.0–34.0)
MCHC: 33.4 g/dL (ref 30.0–36.0)
MCV: 84 fL (ref 80.0–100.0)
Platelets: 218 10*3/uL (ref 150–400)
RBC: 4.95 MIL/uL (ref 3.87–5.11)
RDW: 11.4 % — ABNORMAL LOW (ref 11.5–15.5)
WBC: 8.5 10*3/uL (ref 4.0–10.5)
nRBC: 0 % (ref 0.0–0.2)

## 2020-07-17 LAB — BASIC METABOLIC PANEL
Anion gap: 11 (ref 5–15)
Anion gap: 13 (ref 5–15)
Anion gap: 11 (ref 5–15)
BUN: 42 mg/dL — ABNORMAL HIGH (ref 6–20)
BUN: 38 mg/dL — ABNORMAL HIGH (ref 6–20)
BUN: 30 mg/dL — ABNORMAL HIGH (ref 6–20)
CO2: 25 mmol/L (ref 22–32)
CO2: 25 mmol/L (ref 22–32)
CO2: 25 mmol/L (ref 22–32)
Calcium: 9.3 mg/dL (ref 8.9–10.3)
Calcium: 9.6 mg/dL (ref 8.9–10.3)
Calcium: 8.8 mg/dL — ABNORMAL LOW (ref 8.9–10.3)
Chloride: 90 mmol/L — ABNORMAL LOW (ref 98–111)
Chloride: 92 mmol/L — ABNORMAL LOW (ref 98–111)
Chloride: 90 mmol/L — ABNORMAL LOW (ref 98–111)
Creatinine, Ser: 1.65 mg/dL — ABNORMAL HIGH (ref 0.44–1.00)
Creatinine, Ser: 1.61 mg/dL — ABNORMAL HIGH (ref 0.44–1.00)
Creatinine, Ser: 1.52 mg/dL — ABNORMAL HIGH (ref 0.44–1.00)
GFR, Estimated: 35 mL/min — ABNORMAL LOW (ref >60)
GFR, Estimated: 36 mL/min — ABNORMAL LOW (ref >60)
GFR, Estimated: 39 mL/min — ABNORMAL LOW (ref >60)
Glucose, Bld: 202 mg/dL — ABNORMAL HIGH (ref 70–99)
Glucose, Bld: 188 mg/dL — ABNORMAL HIGH (ref 70–99)
Glucose, Bld: 375 mg/dL — ABNORMAL HIGH (ref 70–99)
Potassium: 3.1 mmol/L — ABNORMAL LOW (ref 3.5–5.1)
Potassium: 3.2 mmol/L — ABNORMAL LOW (ref 3.5–5.1)
Potassium: 4.1 mmol/L (ref 3.5–5.1)
Sodium: 126 mmol/L — ABNORMAL LOW (ref 135–145)
Sodium: 130 mmol/L — ABNORMAL LOW (ref 135–145)
Sodium: 126 mmol/L — ABNORMAL LOW (ref 135–145)

## 2020-07-17 LAB — BETA-HYDROXYBUTYRIC ACID
Beta-Hydroxybutyric Acid: 1.64 mmol/L — ABNORMAL HIGH (ref 0.05–0.27)
Beta-Hydroxybutyric Acid: 0.92 mmol/L — ABNORMAL HIGH (ref 0.05–0.27)

## 2020-07-17 LAB — GLUCOSE, CAPILLARY
Glucose-Capillary: 299 mg/dL — ABNORMAL HIGH (ref 70–99)
Glucose-Capillary: 203 mg/dL — ABNORMAL HIGH (ref 70–99)
Glucose-Capillary: 240 mg/dL — ABNORMAL HIGH (ref 70–99)
Glucose-Capillary: 432 mg/dL — ABNORMAL HIGH (ref 70–99)
Glucose-Capillary: 339 mg/dL — ABNORMAL HIGH (ref 70–99)
Glucose-Capillary: 210 mg/dL — ABNORMAL HIGH (ref 70–99)

## 2020-07-17 LAB — HIV ANTIBODY (ROUTINE TESTING W REFLEX): HIV Screen 4th Generation wRfx: NONREACTIVE

## 2020-07-17 MED ORDER — INSULIN ASPART 100 UNIT/ML IJ SOLN
0.0000 [IU] | Freq: Three times a day (TID) | INTRAMUSCULAR | Status: DC
  Administered 2020-07-17: 5 [IU] via SUBCUTANEOUS
  Administered 2020-07-17: 15 [IU] via SUBCUTANEOUS
  Administered 2020-07-17: 5 [IU] via SUBCUTANEOUS
  Administered 2020-07-18: 8 [IU] via SUBCUTANEOUS
  Administered 2020-07-18 (×2): 5 [IU] via SUBCUTANEOUS
  Administered 2020-07-19 (×2): 8 [IU] via SUBCUTANEOUS
  Administered 2020-07-19: 5 [IU] via SUBCUTANEOUS
  Administered 2020-07-20: 11 [IU] via SUBCUTANEOUS
  Administered 2020-07-20: 8 [IU] via SUBCUTANEOUS
  Administered 2020-07-20: 5 [IU] via SUBCUTANEOUS
  Administered 2020-07-21: 3 [IU] via SUBCUTANEOUS

## 2020-07-17 MED ORDER — INSULIN ASPART 100 UNIT/ML IJ SOLN
3.0000 [IU] | Freq: Three times a day (TID) | INTRAMUSCULAR | Status: DC
  Administered 2020-07-18 – 2020-07-20 (×9): 3 [IU] via SUBCUTANEOUS

## 2020-07-17 MED ORDER — POTASSIUM CHLORIDE 10 MEQ/100ML IV SOLN
10.0000 meq | INTRAVENOUS | Status: AC
  Administered 2020-07-17 (×3): 10 meq via INTRAVENOUS
  Filled 2020-07-17 (×3): qty 100

## 2020-07-17 MED ORDER — DEXTROSE IN LACTATED RINGERS 5 % IV SOLN: INTRAVENOUS | Status: AC

## 2020-07-17 MED ORDER — HYDROXYZINE HCL 10 MG PO TABS
25.0000 mg | ORAL_TABLET | Freq: Every day | ORAL | Status: DC
  Administered 2020-07-17 – 2020-07-20 (×4): 50 mg via ORAL
  Filled 2020-07-17 (×4): qty 5

## 2020-07-17 MED ORDER — METOPROLOL TARTRATE 12.5 MG HALF TABLET
12.5000 mg | ORAL_TABLET | Freq: Two times a day (BID) | ORAL | Status: DC
  Administered 2020-07-17 – 2020-07-21 (×8): 12.5 mg via ORAL
  Filled 2020-07-17 (×8): qty 1

## 2020-07-17 MED ORDER — SODIUM CHLORIDE 0.9 % IV SOLN: INTRAVENOUS | Status: DC

## 2020-07-17 MED ORDER — INSULIN GLARGINE 100 UNIT/ML ~~LOC~~ SOLN
10.0000 [IU] | Freq: Every day | SUBCUTANEOUS | Status: DC
  Administered 2020-07-17: 10 [IU] via SUBCUTANEOUS
  Filled 2020-07-17 (×3): qty 0.1

## 2020-07-17 MED ORDER — MINERAL OIL RE ENEM
1.0000 | ENEMA | Freq: Once | RECTAL | Status: AC
  Administered 2020-07-17: 1 via RECTAL
  Filled 2020-07-17: qty 1

## 2020-07-17 MED ORDER — INSULIN REGULAR(HUMAN) IN NACL 100-0.9 UT/100ML-% IV SOLN
INTRAVENOUS | Status: AC
  Administered 2020-07-17: 4 [IU]/h via INTRAVENOUS

## 2020-07-17 MED ORDER — INSULIN ASPART 100 UNIT/ML IJ SOLN
6.0000 [IU] | Freq: Once | INTRAMUSCULAR | Status: AC
  Administered 2020-07-17: 6 [IU] via SUBCUTANEOUS

## 2020-07-17 MED ORDER — INSULIN ASPART 100 UNIT/ML IJ SOLN
0.0000 [IU] | Freq: Every day | INTRAMUSCULAR | Status: DC
  Administered 2020-07-17: 2 [IU] via SUBCUTANEOUS
  Administered 2020-07-19 – 2020-07-20 (×2): 3 [IU] via SUBCUTANEOUS

## 2020-07-17 MED ORDER — INSULIN GLARGINE 100 UNIT/ML ~~LOC~~ SOLN
20.0000 [IU] | Freq: Two times a day (BID) | SUBCUTANEOUS | Status: DC
  Administered 2020-07-17 – 2020-07-19 (×4): 20 [IU] via SUBCUTANEOUS
  Filled 2020-07-17 (×5): qty 0.2

## 2020-07-17 MED ORDER — INSULIN ASPART 100 UNIT/ML IJ SOLN: 11.0000 [IU] | Freq: Once | INTRAMUSCULAR | Status: DC

## 2020-07-17 NOTE — Discharge Instructions (Signed)

## 2020-07-17 NOTE — Plan of Care (Signed)
  RD consulted for nutrition education regarding diabetes.   No results found for: HGBA1C PTA DM medications are 15 units insulin lispro protamine and lispro, 40 units insulin glargine BID, and 100 mg [place] daily.   Labs reviewed: CBGS: 188-299 (inpatient orders for glycemic control are 0-15 units insulin aspart TID, 0-5 units insulin daily, and 10 units insulin glargine daily).    Spoke with pt at bedside, who reports feeling better today. She shares that she was experiencing nausea, vomiting, and diarrhea for 3 days PTA and was unable to keep foods or liquids down during that time. Pt explains that she lives in [place], [place] and has been visiting a friend in [place] over the past 3 weeks. She reports she typically consumes 3 meals per day (Breakfast: grits and eggs and coffee with cream and splenda; Lunch: steak and gravy, mashed potatoes, and green beans; Dinner: chicken, sweet potatoes, and green beans). She usually drinks tea with splenda. Pt admits that she has been "eating what I shouldn't have"; when asked to elaborate, she shares that she has been drinking soda and consuming potato chips between meals- noted soda bottle and bag of chips in her personal belongings.   Pt shares with this RD "it's hard to eat when you have diabetes". RD reviewed diet recall and focused on specific changes she could make. Pt reports she likes fruit and salad and is willing to drink coke zero instead of regular soda.   Pt shares that she often has difficulty accessing medications. Her DM is managed by her PCP in [place], [place], however, does not remember the last time she saw him (estimates it was sometime in the spring). She shares that the pharmacy often does not have her medications when she refills them and has to wait 2-3 days to get them and often goes without during this time period. She also reports that she often has difficulty affording her medications.   RD suspects that discretionary snacking and well as  poor medication access is contributing to poor glycemic control.   RD provided "Carbohydrate Counting for People with Diabetes" handout from the Academy of [employer]. Discussed different food groups and their effects on blood sugar, emphasizing carbohydrate-containing foods. Provided list of carbohydrates and recommended serving sizes of common foods.  Discussed importance of controlled and consistent carbohydrate intake throughout the day. Provided examples of ways to balance meals/snacks and encouraged intake of high-fiber, whole grain complex carbohydrates. Teach back method used.  Expect fair compliance.  Current diet order is heart healthy/ carb modified, patient is consuming approximately 100% of meals at this time. Labs and medications reviewed. No further nutrition interventions warranted at this time. RD contact information provided. If additional nutrition issues arise, please re-consult RD.  Levada Schilling, RD, LDN, CDCES Registered Dietitian II Certified Diabetes Care and Education Specialist Please refer to [facility] for RD and/or RD on-call/weekend/after hours pager

## 2020-07-17 NOTE — Progress Notes (Signed)
Inpatient Diabetes Program Recommendations  AACE/ADA: New Consensus Statement on Inpatient Glycemic Control (2015)  Target Ranges:  Prepandial:   less than 140 mg/dL      Peak postprandial:   less than 180 mg/dL (1-2 hours)      Critically ill patients:  140 - 180 mg/dL   Lab Results  Component Value Date   GLUCAP 240 (H) 07/17/2020    Review of Glycemic Control  Diabetes history: DM2 Outpatient Diabetes medications: 75/25 15 units QAM, Lantus 40 units BID, Amaryl 2 mg QD, Actos 30 mg QD, Januvia 100 mg QD Current orders for Inpatient glycemic control: Novolog 0-15 units TID and Lantus 10 units QHS  Spoke with patient briefly at bedside.  She is very sleepy.  She verified above home medications.  Per RD notes she has difficulty assessing DM medications at times.  She lives in [place] and is visiting here in [place].  Will follow up with patient when more alert.    Will continue to follow while inpatient.  Thank you, Dulce Sellar, RN, BSN Diabetes Coordinator Inpatient Diabetes Program [phone] (team pager from 8a-5p)

## 2020-07-17 NOTE — Progress Notes (Signed)
PROGRESS NOTE    Beth Wilkinson  WFU:[id number] DOB: Jun 05, 1960 DOA: 07/16/2020 PCP: Pcp, No    No chief complaint on file.   Brief Narrative:   Beth Wilkinson is a 60 y.o. female with medical history significant of diabetes mellitus type 2 and CVA who presents with complaints of vomiting.  She lives in [place], [place] and has been visiting a friend here in [place] for the last 3 weeks.  Over the last 3 days however she reports that she has had difficulty controlling her blood sugars.  She had ran out of some of her short acting insulin and she was unable to keep down some of her oral medications.  Reports associated symptoms of headache and nausea.  Emesis has been nonbloody in appearance. Her niece makes note the patient has issues with compliance with medications in the past and has previously gone missing for peers in time for which family does know where she has gone.  She previously has had a stroke previously in the past with complaints of slurred speech, but patient reports that those symptoms have resolved.   Subjective:  Report stomach pain after lunch, currently no pain, feel like going to vomit but no vomiting in the hospital, last was a few days ago Reports last bm a week ago , she would like to have an enema She request bentyl and hydroxyzine which both resumed  Blood glucose fluctuating  Currently no confusion  Assessment & Plan:   Principal Problem:   Hyperosmolar hyperglycemic state (HHS) (HCC) Active Problems:   AKI (acute kidney injury) (HCC)   Acute metabolic encephalopathy   Metabolic acidosis with increased anion gap and accumulation of organic acids   History of CVA (cerebrovascular accident)  DKA, acute metabolic encephalopathy, presents on admission -History of insulin-dependent type 2 diabetes, A1c in process -Confusion likely due to elevated blood glucose, CT of head no acute findings, UDS negative -I believe the patient presented with DKA due to elevated  anion gap, elevated beta hydroxybutyrate, positive urine ketones, serum creatinine was not checked -She was on insulin drip from admission to 7 /6 at 3 AM, beta hydroxybutyrate has normalized -Continue blood glucose control, adjust insulin -Diabetes and diet education  Nausea vomiting -CT abdomen no acute findings -Could be from DKA , but also could be from  underlying gastroparesis exacerbation, noted to be on Reglan at home, not sure if he is compliant with Reglan -Also report constipation, no BM for a week, she agreed with enema -Continue PPI, as needed antiemetics   AKI on CKD 3B -BUN 60/creatinine 3 on presentation -Likely prerenal due to dehydration from hyper osmolar state and poor oral intake -She received lactated ringer initially, she continued to have poor oral intake, changed to normal saline, BUN 30 creatinine 1.52 today   Hyponatremia -Likely combination from pseudohyponatremia and poor oral intake -Continue to adjust insulin, change IV fluids from LR to normal saline -Corrected sodium 133 , repeat BMP in the morning  Hypertension Home blood pressure medication Lopressor was held on presentation due to low borderline BP BP has improved, resume Lopressor at lower dose with holding parameters Continue IV fluids  History of CVA Continue statin    Body mass index is 24.03 kg/m.Marland Kitchen     Unresulted Labs (From admission, onward)     Start     Ordered   07/18/20 0500  CBC with Differential/Platelet  Tomorrow morning,   R       Question:  Specimen collection method  Answer:  Lab=Lab collect   07/17/20 1604   07/18/20 0500  Comprehensive metabolic panel  Tomorrow morning,   R       Question:  Specimen collection method  Answer:  Lab=Lab collect   07/17/20 1604   07/18/20 0500  Magnesium  Tomorrow morning,   R       Question:  Specimen collection method  Answer:  Lab=Lab collect   07/17/20 1604   07/17/20 0330  Hemoglobin A1c  Add-on,   AD       Comments: To assess  prior glycemic control    07/17/20 0329              DVT prophylaxis: heparin injection 5,000 Units Start: 07/16/20 2215   Code Status: Full Family Communication: Patient Disposition:   Status is: Inpatient  Dispo: The patient is from: Home              Anticipated d/c is to: To be determined              Anticipated d/c date is: Patient not medically stable to discharge                Consultants:  None  Procedures:  None  Antimicrobials:   Anti-infectives (From admission, onward)    None           Objective: Vitals:   07/17/20 1032 07/17/20 1132 07/17/20 1536 07/17/20 2026  BP:  (!) 109/93 (!) 116/55 108/62  Pulse: 89 (!) 105 95 96  Resp: 18 18 18 14   Temp:  98.5 F (36.9 C) 98.5 F (36.9 C) 98.5 F (36.9 C)  TempSrc:  Oral Oral Oral  SpO2: 96% 99% 97% 100%  Weight:      Height:        Intake/Output Summary (Last 24 hours) at 07/17/2020 2059 Last data filed at 07/17/2020 1732 Gross per 24 hour  Intake 3391.23 ml  Output 1100 ml  Net 2291.23 ml   Filed Weights   07/16/20 1239 07/17/20 0506  Weight: 70.8 kg 63.5 kg    Examination:  General exam: calm, NAD, currently no confusion Respiratory system: Clear to auscultation. Respiratory effort normal. Cardiovascular system: S1 & S2 heard, RRR. No JVD, no murmur, No pedal edema. Gastrointestinal system: Abdomen is nondistended, soft and nontender.  Normal bowel sounds heard. Central nervous system: Alert and oriented. No focal neurological deficits. Extremities: Symmetric 5 x 5 power. Skin: No rashes, lesions or ulcers Psychiatry: Judgement and insight appear normal. Mood & affect appropriate.  Speech is clear today    Data Reviewed: I have personally reviewed following labs and imaging studies  CBC: Recent Labs  Lab 07/16/20 0909 07/16/20 1238 07/17/20 0352  WBC 8.2  --  8.5  HGB 13.3 15.0 13.9  HCT 40.5 44.0 41.6  MCV 86.2  --  84.0  PLT 248  --  218    Basic Metabolic  Panel: Recent Labs  Lab 07/16/20 1231 07/16/20 1238 07/16/20 1625 07/16/20 2035 07/17/20 0028 07/17/20 0352 07/17/20 1611  NA 116*   < > 127* 125* 126* 130* 126*  K 4.6   < > 4.0 3.1* 3.1* 3.2* 4.1  CL 74*  --  89* 89* 90* 92* 90*  CO2 21*  --  23 22 25 25 25   GLUCOSE 1,010*  --  490* 244* 202* 188* 375*  BUN 57*  --  51* 47* 42* 38* 30*  CREATININE 3.00*  --  2.19* 1.73* 1.65* 1.61* 1.52*  CALCIUM 9.4  --  9.5 9.3 9.3 9.6 8.8*  MG 2.9*  --   --   --   --   --   --    < > = values in this interval not displayed.    GFR: Estimated Creatinine Clearance: 34 mL/min (A) (by C-G formula based on SCr of 1.52 mg/dL (H)).  Liver Function Tests: Recent Labs  Lab 07/16/20 0909  AST 19  ALT 13  ALKPHOS 67  BILITOT 1.6*  PROT 7.5  ALBUMIN 3.8    CBG: Recent Labs  Lab 07/17/20 0503 07/17/20 0605 07/17/20 1136 07/17/20 1537 07/17/20 1808  GLUCAP 299* 203* 240* 432* 339*     Recent Results (from the past 240 hour(s))  Resp Panel by RT-PCR (Flu A&B, Covid) Nasopharyngeal Swab     Status: None   Collection Time: 07/16/20 12:27 PM   Specimen: Nasopharyngeal Swab; Nasopharyngeal(NP) swabs in vial transport medium  Result Value Ref Range Status   SARS Coronavirus 2 by RT PCR NEGATIVE NEGATIVE Final    Comment: (NOTE) SARS-CoV-2 target nucleic acids are NOT DETECTED.  The SARS-CoV-2 RNA is generally detectable in upper respiratory specimens during the acute phase of infection. The lowest concentration of SARS-CoV-2 viral copies this assay can detect is 138 copies/mL. A negative result does not preclude SARS-Cov-2 infection and should not be used as the sole basis for treatment or other patient management decisions. A negative result may occur with  improper specimen collection/handling, submission of specimen other than nasopharyngeal swab, presence of viral mutation(s) within the areas targeted by this assay, and inadequate number of viral copies(<138 copies/mL). A  negative result must be combined with clinical observations, patient history, and epidemiological information. The expected result is Negative.  Fact Sheet for Patients:  BloggerCourse.com  Fact Sheet for Healthcare Providers:  SeriousBroker.it  This test is no t yet approved or cleared by the [place] FDA and  has been authorized for detection and/or diagnosis of SARS-CoV-2 by FDA under an Emergency Use Authorization (EUA). This EUA will remain  in effect (meaning this test can be used) for the duration of the COVID-19 declaration under Section 564(b)(1) of the Act, 21 U.S.C.section 360bbb-3(b)(1), unless the authorization is terminated  or revoked sooner.       Influenza A by PCR NEGATIVE NEGATIVE Final   Influenza B by PCR NEGATIVE NEGATIVE Final    Comment: (NOTE) The Xpert Xpress SARS-CoV-2/FLU/RSV plus assay is intended as an aid in the diagnosis of influenza from Nasopharyngeal swab specimens and should not be used as a sole basis for treatment. Nasal washings and aspirates are unacceptable for Xpert Xpress SARS-CoV-2/FLU/RSV testing.  Fact Sheet for Patients: BloggerCourse.com  Fact Sheet for Healthcare Providers: SeriousBroker.it  This test is not yet approved or cleared by the [place] FDA and has been authorized for detection and/or diagnosis of SARS-CoV-2 by FDA under an Emergency Use Authorization (EUA). This EUA will remain in effect (meaning this test can be used) for the duration of the COVID-19 declaration under Section 564(b)(1) of the Act, 21 U.S.C. section 360bbb-3(b)(1), unless the authorization is terminated or revoked.  Performed at [facility], [address], [place], [place] [address]          Radiology Studies: CT Abdomen Pelvis Wo Contrast  Result Date: 07/16/2020 CLINICAL DATA:  Midline abdominal pain and nausea free  sugar blood glucose greater than 1,000 EXAM: CT ABDOMEN AND PELVIS WITHOUT CONTRAST TECHNIQUE: Multidetector CT imaging of the abdomen  and pelvis was performed following the standard protocol without IV contrast. COMPARISON:  None. FINDINGS: Lower chest: Hypoventilatory change in the dependent lungs. No acute abnormality. Hepatobiliary: Unremarkable noncontrast appearance of the hepatic parenchyma. Gallbladder is unremarkable. No biliary ductal dilation. Pancreas: The pancreatic parenchyma is within normal noncontrast appearance. Spleen: Within normal limits. Adrenals/Urinary Tract: Thickening of the bilateral adrenal glands without discrete nodularity, favored represent hyperplasia. No hydronephrosis. No renal, ureteral or bladder calculi visualized. Homogeneously hypodense 3 cm right lower pole renal lesion with Hounsfield units approaching water density, likely representing a cyst. Additional right upper pole hypodense renal lesion which is too small to accurately characterize but statistically likely to represent a cyst. Urinary bladder is unremarkable. Stomach/Bowel: Stomach is unremarkable for degree of distension. No pathologic dilation of small bowel. The appendix and terminal ileum are grossly unremarkable. Hyperdense material throughout the ascending and transverse colon, likely representing ingested material such as anti diuretic. No evidence of acute colonic inflammation. Vascular/Lymphatic: Aortic atherosclerosis without aneurysmal dilation. No pathologically enlarged abdominal or pelvic lymph nodes. Reproductive: Uterus and bilateral adnexa are unremarkable. Other: No abdominopelvic ascites. Musculoskeletal: Multilevel degenerative changes spine. No aggressive lytic or blastic lesion of bone. IMPRESSION: 1. No acute findings in the abdomen or pelvis. 2.  Aortic Atherosclerosis (ICD10-I70.0). Electronically Signed   By: Maudry Mayhew MD   On: 07/16/2020 14:39   CT Head Wo Contrast  Result Date:  07/16/2020 CLINICAL DATA:  Left frontal headache. EXAM: CT HEAD WITHOUT CONTRAST TECHNIQUE: Contiguous axial images were obtained from the base of the skull through the vertex without intravenous contrast. COMPARISON:  None. FINDINGS: Brain: No evidence of acute infarction, hemorrhage, hydrocephalus, extra-axial collection or mass lesion/mass effect. Mild generalized cerebral atrophy. Scattered mild subcortical white matter hypodensities are nonspecific, but favored to reflect chronic microvascular ischemic changes. Vascular: Atherosclerotic vascular calcification of the carotid siphons. No hyperdense vessel. Skull: Normal. Negative for fracture or focal lesion. Sinuses/Orbits: No acute finding. Other: None. IMPRESSION: 1. No acute intracranial abnormality. 2. Mild cerebral atrophy and chronic microvascular ischemic changes. Electronically Signed   By: Obie Dredge M.D.   On: 07/16/2020 14:33   DG Chest Portable 1 View  Result Date: 07/16/2020 CLINICAL DATA:  Onset vomiting last night. EXAM: PORTABLE CHEST 1 VIEW COMPARISON:  None. FINDINGS: Lungs clear. Heart size normal. No pneumothorax or pleural fluid. No acute or focal bony abnormality. IMPRESSION: Negative chest. Electronically Signed   By: Drusilla Kanner M.D.   On: 07/16/2020 12:28        Scheduled Meds:  dicyclomine  10 mg Oral QID   fluticasone  1 spray Each Nare Daily   heparin  5,000 Units Subcutaneous Q8H   hydrOXYzine  25-50 mg Oral QHS   insulin aspart  0-15 Units Subcutaneous TID WC   insulin aspart  0-5 Units Subcutaneous QHS   [START ON 07/18/2020] insulin aspart  3 Units Subcutaneous TID WC   insulin glargine  20 Units Subcutaneous BID   metoprolol tartrate  12.5 mg Oral BID   montelukast  10 mg Oral QHS   pantoprazole (PROTONIX) IV  40 mg Intravenous Q24H   rosuvastatin  20 mg Oral QHS   sodium chloride flush  3 mL Intravenous Q12H   topiramate  100 mg Oral BID   Continuous Infusions:  sodium chloride 75 mL/hr at  07/17/20 1619     LOS: 1 day   Time spent: Greater than 50% of this time was spent in counseling, explanation of diagnosis, planning of further  management, and coordination of care.   Voice Recognition Reubin Milan dictation system was used to create this note, attempts have been made to correct errors. Please contact the author with questions and/or clarifications.   Albertine Grates, MD PhD FACP [facility]  Available via Epic secure chat 7am-7pm for nonurgent issues Please page for urgent issues To page the attending provider between 7A-7P or the covering provider during after hours 7P-7A, please log into the web site [email or web address] and access using universal [place] password for that web site. If you do not have the password, please call the hospital operator.    07/17/2020, 8:59 PM

## 2020-07-18 LAB — COMPREHENSIVE METABOLIC PANEL
ALT: 12 U/L (ref 0–44)
AST: 21 U/L (ref 15–41)
Albumin: 2.8 g/dL — ABNORMAL LOW (ref 3.5–5.0)
Alkaline Phosphatase: 49 U/L (ref 38–126)
Anion gap: 9 (ref 5–15)
BUN: 25 mg/dL — ABNORMAL HIGH (ref 6–20)
CO2: 25 mmol/L (ref 22–32)
Calcium: 8.5 mg/dL — ABNORMAL LOW (ref 8.9–10.3)
Chloride: 96 mmol/L — ABNORMAL LOW (ref 98–111)
Creatinine, Ser: 1.53 mg/dL — ABNORMAL HIGH (ref 0.44–1.00)
GFR, Estimated: 39 mL/min — ABNORMAL LOW (ref >60)
Glucose, Bld: 203 mg/dL — ABNORMAL HIGH (ref 70–99)
Potassium: 3.4 mmol/L — ABNORMAL LOW (ref 3.5–5.1)
Sodium: 130 mmol/L — ABNORMAL LOW (ref 135–145)
Total Bilirubin: 1.2 mg/dL (ref 0.3–1.2)
Total Protein: 5.2 g/dL — ABNORMAL LOW (ref 6.5–8.1)

## 2020-07-18 LAB — GLUCOSE, CAPILLARY
Glucose-Capillary: 210 mg/dL — ABNORMAL HIGH (ref 70–99)
Glucose-Capillary: 270 mg/dL — ABNORMAL HIGH (ref 70–99)
Glucose-Capillary: 212 mg/dL — ABNORMAL HIGH (ref 70–99)
Glucose-Capillary: 162 mg/dL — ABNORMAL HIGH (ref 70–99)

## 2020-07-18 LAB — CBC WITH DIFFERENTIAL/PLATELET
Abs Immature Granulocytes: 0.02 10*3/uL (ref 0.00–0.07)
Basophils Absolute: 0 10*3/uL (ref 0.0–0.1)
Basophils Relative: 0 %
Eosinophils Absolute: 0 10*3/uL (ref 0.0–0.5)
Eosinophils Relative: 0 %
HCT: 31.5 % — ABNORMAL LOW (ref 36.0–46.0)
Hemoglobin: 10.8 g/dL — ABNORMAL LOW (ref 12.0–15.0)
Immature Granulocytes: 0 %
Lymphocytes Relative: 53 %
Lymphs Abs: 3.1 10*3/uL (ref 0.7–4.0)
MCH: 28.8 pg (ref 26.0–34.0)
MCHC: 34.3 g/dL (ref 30.0–36.0)
MCV: 84 fL (ref 80.0–100.0)
Monocytes Absolute: 0.4 10*3/uL (ref 0.1–1.0)
Monocytes Relative: 7 %
Neutro Abs: 2.3 10*3/uL (ref 1.7–7.7)
Neutrophils Relative %: 40 %
Platelets: 157 10*3/uL (ref 150–400)
RBC: 3.75 MIL/uL — ABNORMAL LOW (ref 3.87–5.11)
RDW: 11.3 % — ABNORMAL LOW (ref 11.5–15.5)
WBC: 5.9 10*3/uL (ref 4.0–10.5)
nRBC: 0 % (ref 0.0–0.2)

## 2020-07-18 LAB — MAGNESIUM: Magnesium: 1.9 mg/dL (ref 1.7–2.4)

## 2020-07-18 LAB — HEMOGLOBIN A1C
Hgb A1c MFr Bld: >15.5 % — ABNORMAL HIGH (ref 4.8–5.6)
Mean Plasma Glucose: >398 mg/dL

## 2020-07-18 MED ORDER — POTASSIUM CHLORIDE CRYS ER 20 MEQ PO TBCR
40.0000 meq | EXTENDED_RELEASE_TABLET | Freq: Once | ORAL | Status: AC
  Administered 2020-07-18: 40 meq via ORAL
  Filled 2020-07-18: qty 2

## 2020-07-18 MED ORDER — MAGNESIUM SULFATE IN D5W 1-5 GM/100ML-% IV SOLN
1.0000 g | Freq: Once | INTRAVENOUS | Status: AC
  Administered 2020-07-18: 1 g via INTRAVENOUS
  Filled 2020-07-18: qty 100

## 2020-07-18 MED ORDER — LIVING WELL WITH DIABETES BOOK
Freq: Once | Status: AC
  Filled 2020-07-18: qty 1

## 2020-07-18 MED ORDER — SENNOSIDES-DOCUSATE SODIUM 8.6-50 MG PO TABS
1.0000 | ORAL_TABLET | Freq: Two times a day (BID) | ORAL | Status: DC
  Administered 2020-07-18 – 2020-07-21 (×6): 1 via ORAL
  Filled 2020-07-18 (×6): qty 1

## 2020-07-18 MED ORDER — POLYETHYLENE GLYCOL 3350 17 G PO PACK
17.0000 g | PACK | Freq: Two times a day (BID) | ORAL | Status: DC
  Administered 2020-07-18 – 2020-07-21 (×4): 17 g via ORAL
  Filled 2020-07-18 (×4): qty 1

## 2020-07-18 NOTE — Plan of Care (Signed)
  Problem: Education: Goal: Knowledge of General Education information will improve Description Including pain rating scale, medication(s)/side effects and non-pharmacologic comfort measures Outcome: Progressing   

## 2020-07-18 NOTE — Progress Notes (Signed)
Inpatient Diabetes Program Recommendations  AACE/ADA: New Consensus Statement on Inpatient Glycemic Control (2015)  Target Ranges:  Prepandial:   less than 140 mg/dL      Peak postprandial:   less than 180 mg/dL (1-2 hours)      Critically ill patients:  140 - 180 mg/dL   Lab Results  Component Value Date   GLUCAP 270 (H) 07/18/2020    Review of Glycemic Control Results for Beth Wilkinson, Beth Wilkinson (MRN [id number]) as of 07/18/2020 12:40  Ref. Range 07/17/2020 15:37 07/17/2020 18:08 07/17/2020 21:04 07/18/2020 05:50 07/18/2020 12:00  Glucose-Capillary Latest Ref Range: 70 - 99 mg/dL 401 (H) 027 (H) 253 (H) 210 (H) 270 (H)    Diabetes history: DM2 Outpatient Diabetes medications: 75/25 15 units QAM, Lantus 40 units BID, Amaryl 2 mg QD, Actos 30 mg QD, Januvia 100 mg QD Current orders for Inpatient glycemic control: Novolog 0-15 units TID and Lantus 20 units BID, Novolog 3 units   Inpatient Diabetes Program Recommendations:    Increase meal coverage to Novolog 6 units TID if eats at least 50%.  Spoke with patient again today at bedside.  She is much more alert.  Again she confirms above home medications.  Seems strange to be on all of that and not just basal/bolus.  She is insured.  She states she did not see her PCP prior to leaving [place] to visit in [place] and did not have enough insulin.  She states normally she does not have issues with obtaining her DM medications.  She states she will be returning to [facility] once she is discharged.  She denies episodes of hypoglycemia but is aware how to treat.  She states she will see her PCP when she returns to [facility].    Please consider having insulins delivered to room prior to DC as she does not have any here in [place].   Lantus Solostar insulin [id number] Novolog [id number]  Will continue to follow while inpatient.  Thank you, Dulce Sellar, RN, BSN Diabetes Coordinator Inpatient Diabetes Program [phone] (team pager from 8a-5p)

## 2020-07-18 NOTE — Progress Notes (Signed)
PROGRESS NOTE    Beth Wilkinson  IRS:[id number] DOB: 03-26-1960 DOA: 07/16/2020 PCP: Pcp, No    No chief complaint on file.   Brief Narrative:   Beth Wilkinson is a 60 y.o. female with medical history significant of diabetes mellitus type 2 and CVA who presents with complaints of vomiting.  She lives in [place], [place] and has been visiting a friend here in [place] for the last 3 weeks.  Over the last 3 days however she reports that she has had difficulty controlling her blood sugars.  She had ran out of some of her short acting insulin and she was unable to keep down some of her oral medications.  Reports associated symptoms of headache and nausea.  Emesis has been nonbloody in appearance. Her niece makes note the patient has issues with compliance with medications in the past and has previously gone missing for peers in time for which family does know where she has gone.  She previously has had a stroke previously in the past with complaints of slurred speech, but patient reports that those symptoms have resolved.   Subjective:  Report stomach pain after lunch, currently no pain,  No vomiting No result from enema yesterday Reports last bm a week ago Reports feeling better, Currently no confusion She reports she run out all her home meds, she request refill on all of her home meds  Assessment & Plan:   Principal Problem:   Hyperosmolar hyperglycemic state (HHS) (HCC) Active Problems:   AKI (acute kidney injury) (HCC)   Acute metabolic encephalopathy   Metabolic acidosis with increased anion gap and accumulation of organic acids   History of CVA (cerebrovascular accident)  DKA, acute metabolic encephalopathy, presents on admission --Confusion likely due to elevated blood glucose, CT of head no acute findings, UDS negative -I believe the patient presented with DKA due to elevated anion gap, elevated beta hydroxybutyrate, positive urine ketones, serum creatinine was not checked -She  was on insulin drip from admission to 7 /6 at 3 AM, beta hydroxybutyrate has normalized -Continue blood glucose control, adjust insulin   insulin-dependent type 2 diabetes, A1c 15.5 Suspect medication noncompliance Diabetes and diet education  Nausea vomiting -CT abdomen no acute findings -Could be from DKA , but also could be from  underlying gastroparesis exacerbation, noted to be on Reglan at home, not sure if she is compliant with Reglan -Continue PPI, as needed antiemetics   constipation, no BM for a week, no results from enema, start MiraLAX twice daily and Senokot twice daily, increase activity  AKI on CKD 3B -BUN 60/creatinine 3 on presentation -Likely prerenal due to dehydration from hyper osmolar state and poor oral intake -She received lactated ringer initially, she continued to have poor oral intake, changed to normal saline, BUN 30 creatinine 1.52 today, likely close to baseline   Hyponatremia -Likely combination from pseudohyponatremia and poor oral intake -Continue to adjust insulin, change IV fluids from LR to normal saline -Corrected sodium 133  -Oral intake has improved, encourage oral intake  Hypokalemia/hypomagnesemia Replaced K and mag  Hypertension Home blood pressure medication Lopressor was held on presentation due to low borderline BP Received hydration ,BP has improved, resume Lopressor at lower dose with holding parameters   History of CVA Continue statin    Body mass index is 24.67 kg/m.Marland Kitchen     Unresulted Labs (From admission, onward)     Start     Ordered   07/19/20 0500  CBC with Differential/Platelet  Tomorrow morning,  R       Question:  Specimen collection method  Answer:  Lab=Lab collect   07/18/20 1838   07/19/20 0500  Basic metabolic panel  Tomorrow morning,   R       Question:  Specimen collection method  Answer:  Lab=Lab collect   07/18/20 1838              DVT prophylaxis: heparin injection 5,000 Units Start: 07/16/20  2215   Code Status: Full Family Communication: Niece over the phone Disposition:   Status is: Inpatient  Dispo: The patient is from: Home              Anticipated d/c is to: Possible tomorrow              Anticipated d/c date is: Will need bring medication to her prior to discharge                Consultants:  None  Procedures:  None  Antimicrobials:   Anti-infectives (From admission, onward)    None           Objective: Vitals:   07/18/20 0353 07/18/20 0736 07/18/20 1200 07/18/20 1555  BP: 121/65     Pulse: 85     Resp: 17     Temp: 98.3 F (36.8 C) 98.5 F (36.9 C) (!) 97.4 F (36.3 C) 98.4 F (36.9 C)  TempSrc: Oral Oral Oral Oral  SpO2: 96%     Weight:      Height:        Intake/Output Summary (Last 24 hours) at 07/18/2020 1839 Last data filed at 07/18/2020 1817 Gross per 24 hour  Intake 1440 ml  Output 2300 ml  Net -860 ml   Filed Weights   07/16/20 1239 07/17/20 0506 07/18/20 0014  Weight: 70.8 kg 63.5 kg 65.2 kg    Examination:  General exam: calm, NAD, currently no confusion Respiratory system: Clear to auscultation. Respiratory effort normal. Cardiovascular system: S1 & S2 heard, RRR. No JVD, no murmur, No pedal edema. Gastrointestinal system: Abdomen is nondistended, soft and nontender.  Normal bowel sounds heard. Central nervous system: Alert and oriented. No focal neurological deficits. Extremities: Symmetric 5 x 5 power. Skin: No rashes, lesions or ulcers Psychiatry: Judgement and insight appear normal. Mood & affect appropriate.  Speech is clear today    Data Reviewed: I have personally reviewed following labs and imaging studies  CBC: Recent Labs  Lab 07/16/20 0909 07/16/20 1238 07/17/20 0352 07/18/20 0356  WBC 8.2  --  8.5 5.9  NEUTROABS  --   --   --  2.3  HGB 13.3 15.0 13.9 10.8*  HCT 40.5 44.0 41.6 31.5*  MCV 86.2  --  84.0 84.0  PLT 248  --  218 157    Basic Metabolic Panel: Recent Labs  Lab 07/16/20 1231  07/16/20 1238 07/16/20 2035 07/17/20 0028 07/17/20 0352 07/17/20 1611 07/18/20 0356  NA 116*   < > 125* 126* 130* 126* 130*  K 4.6   < > 3.1* 3.1* 3.2* 4.1 3.4*  CL 74*   < > 89* 90* 92* 90* 96*  CO2 21*   < > 22 25 25 25 25   GLUCOSE 1,010*   < > 244* 202* 188* 375* 203*  BUN 57*   < > 47* 42* 38* 30* 25*  CREATININE 3.00*   < > 1.73* 1.65* 1.61* 1.52* 1.53*  CALCIUM 9.4   < > 9.3 9.3 9.6 8.8* 8.5*  MG  2.9*  --   --   --   --   --  1.9   < > = values in this interval not displayed.    GFR: Estimated Creatinine Clearance: 33.8 mL/min (A) (by C-G formula based on SCr of 1.53 mg/dL (H)).  Liver Function Tests: Recent Labs  Lab 07/16/20 0909 07/18/20 0356  AST 19 21  ALT 13 12  ALKPHOS 67 49  BILITOT 1.6* 1.2  PROT 7.5 5.2*  ALBUMIN 3.8 2.8*    CBG: Recent Labs  Lab 07/17/20 1808 07/17/20 2104 07/18/20 0550 07/18/20 1200 07/18/20 1557  GLUCAP 339* 210* 210* 270* 212*     Recent Results (from the past 240 hour(s))  Resp Panel by RT-PCR (Flu A&B, Covid) Nasopharyngeal Swab     Status: None   Collection Time: 07/16/20 12:27 PM   Specimen: Nasopharyngeal Swab; Nasopharyngeal(NP) swabs in vial transport medium  Result Value Ref Range Status   SARS Coronavirus 2 by RT PCR NEGATIVE NEGATIVE Final    Comment: (NOTE) SARS-CoV-2 target nucleic acids are NOT DETECTED.  The SARS-CoV-2 RNA is generally detectable in upper respiratory specimens during the acute phase of infection. The lowest concentration of SARS-CoV-2 viral copies this assay can detect is 138 copies/mL. A negative result does not preclude SARS-Cov-2 infection and should not be used as the sole basis for treatment or other patient management decisions. A negative result may occur with  improper specimen collection/handling, submission of specimen other than nasopharyngeal swab, presence of viral mutation(s) within the areas targeted by this assay, and inadequate number of viral copies(<138 copies/mL). A  negative result must be combined with clinical observations, patient history, and epidemiological information. The expected result is Negative.  Fact Sheet for Patients:  BloggerCourse.comhttps://www.fda.gov/media/152166/download  Fact Sheet for Healthcare Providers:  SeriousBroker.ithttps://www.fda.gov/media/152162/download  This test is no t yet approved or cleared by the [place] FDA and  has been authorized for detection and/or diagnosis of SARS-CoV-2 by FDA under an Emergency Use Authorization (EUA). This EUA will remain  in effect (meaning this test can be used) for the duration of the COVID-19 declaration under Section 564(b)(1) of the Act, 21 U.S.C.section 360bbb-3(b)(1), unless the authorization is terminated  or revoked sooner.       Influenza A by PCR NEGATIVE NEGATIVE Final   Influenza B by PCR NEGATIVE NEGATIVE Final    Comment: (NOTE) The Xpert Xpress SARS-CoV-2/FLU/RSV plus assay is intended as an aid in the diagnosis of influenza from Nasopharyngeal swab specimens and should not be used as a sole basis for treatment. Nasal washings and aspirates are unacceptable for Xpert Xpress SARS-CoV-2/FLU/RSV testing.  Fact Sheet for Patients: BloggerCourse.comhttps://www.fda.gov/media/152166/download  Fact Sheet for Healthcare Providers: SeriousBroker.ithttps://www.fda.gov/media/152162/download  This test is not yet approved or cleared by the [place] FDA and has been authorized for detection and/or diagnosis of SARS-CoV-2 by FDA under an Emergency Use Authorization (EUA). This EUA will remain in effect (meaning this test can be used) for the duration of the COVID-19 declaration under Section 564(b)(1) of the Act, 21 U.S.C. section 360bbb-3(b)(1), unless the authorization is terminated or revoked.  Performed at [facility], [address] [place], [place] [address]          Radiology Studies: No results found.      Scheduled Meds:  dicyclomine  10 mg Oral QID   fluticasone  1 spray Each Nare Daily    heparin  5,000 Units Subcutaneous Q8H   hydrOXYzine  25-50 mg Oral QHS  insulin aspart  0-15 Units Subcutaneous TID WC   insulin aspart  0-5 Units Subcutaneous QHS   insulin aspart  3 Units Subcutaneous TID WC   insulin glargine  20 Units Subcutaneous BID   metoprolol tartrate  12.5 mg Oral BID   montelukast  10 mg Oral QHS   pantoprazole (PROTONIX) IV  40 mg Intravenous Q24H   potassium chloride  40 mEq Oral Once   rosuvastatin  20 mg Oral QHS   sodium chloride flush  3 mL Intravenous Q12H   topiramate  100 mg Oral BID   Continuous Infusions:  magnesium sulfate bolus IVPB       LOS: 2 days   Time spent: Greater than 50% of this time was spent in counseling, explanation of diagnosis, planning of further management, and coordination of care.   Voice Recognition Reubin Milan dictation system was used to create this note, attempts have been made to correct errors. Please contact the author with questions and/or clarifications.   Albertine Grates, MD PhD FACP [facility]  Available via Epic secure chat 7am-7pm for nonurgent issues Please page for urgent issues To page the attending provider between 7A-7P or the covering provider during after hours 7P-7A, please log into the web site [email or web address] and access using universal [place] password for that web site. If you do not have the password, please call the hospital operator.    07/18/2020, 6:39 PM

## 2020-07-18 NOTE — Evaluation (Signed)
Physical Therapy Evaluation Patient Details Name: Beth Wilkinson MRN: [id number] DOB: [birth year] Today's Date: 07/18/2020   History of Present Illness  60 y.o. femal presents [facility] ED on 07/16/2020 with reports of vomiting and difficulty controlling blood sugar after running out of insulin. Pt admitted for management of nausea and vomiting and DKA. PMH includes diabetes mellitus type 2 and CVA.  Clinical Impression  Pt presents to PT with deficits in gait, balance, endurance, power. Pt often reaching for UE support when ambulating with use of cane, reporting instability and demonstrating increased sway. Pt demonstrates improved stability with use of walker at this time. Pt will benefit from aggressive mobility and encouragement to mobilize this admission (refusing to sit in recliner at end of session). PT recommends outpatient PT and a RW at the time of discharge.    Follow Up Recommendations Outpatient PT    Equipment Recommendations  Rolling walker with 5" wheels (may progress to no needs)    Recommendations for Other Services       Precautions / Restrictions Precautions Precautions: Fall Restrictions Weight Bearing Restrictions: No      Mobility  Bed Mobility Overal bed mobility: Modified Independent             General bed mobility comments: HOB elevated    Transfers Overall transfer level: Needs assistance Equipment used: None Transfers: Sit to/from Stand Sit to Stand: Supervision            Ambulation/Gait Ambulation/Gait assistance: Supervision Gait Distance (Feet): 150 Feet (additional 15' with SPC) Assistive device: Rolling walker (2 wheeled);Straight cane Gait Pattern/deviations: Step-through pattern Gait velocity: functional Gait velocity interpretation: 1.31 - 2.62 ft/sec, indicative of limited community ambulator General Gait Details: pt with slowed step-to gait with high guard with use of SPC, attempting to reach for UE support. With use of RW pt with  steady step through gait, no LOB noted  Stairs            Wheelchair Mobility    Modified Rankin (Stroke Patients Only)       Balance Overall balance assessment: Needs assistance Sitting-balance support: No upper extremity supported;Feet supported Sitting balance-Leahy Scale: Good     Standing balance support: No upper extremity supported Standing balance-Leahy Scale: Fair                               Pertinent Vitals/Pain Pain Assessment: No/denies pain    Home Living Family/patient expects to be discharged to:: Private residence Living Arrangements: Alone Available Help at Discharge:  (none identified, friend initially when in [place] but none in [facility]) Type of Home: Mobile home Home Access: Stairs to enter Entrance Stairs-Rails: Can reach both Entrance Stairs-Number of Steps: 4 Home Layout: One level Home Equipment: Cane - single point      Prior Function Level of Independence: Independent with assistive device(s)         Comments: pt reports ambulating with SPC at baseline, driving     Hand Dominance        Extremity/Trunk Assessment   Upper Extremity Assessment Upper Extremity Assessment: Overall WFL for tasks assessed    Lower Extremity Assessment Lower Extremity Assessment: Generalized weakness    Cervical / Trunk Assessment Cervical / Trunk Assessment: Normal  Communication   Communication: No difficulties  Cognition Arousal/Alertness: Awake/alert Behavior During Therapy: WFL for tasks assessed/performed Overall Cognitive Status: Within Functional Limits for tasks assessed  General Comments General comments (skin integrity, edema, etc.): VSS on RA    Exercises     Assessment/Plan    PT Assessment Patient needs continued PT services  PT Problem List Decreased strength;Decreased balance;Decreased mobility;Decreased knowledge of use of DME       PT Treatment  Interventions DME instruction;Gait training;Stair training;Functional mobility training;Therapeutic activities;Therapeutic exercise;Balance training;Patient/family education    PT Goals (Current goals can be found in the Care Plan section)  Acute Rehab PT Goals Patient Stated Goal: to return to independent mobility PT Goal Formulation: With patient Time For Goal Achievement: 08/01/20 Potential to Achieve Goals: Good    Frequency Min 3X/week   Barriers to discharge Decreased caregiver support      Co-evaluation               AM-PAC PT "6 Clicks" Mobility  Outcome Measure Help needed turning from your back to your side while in a flat bed without using bedrails?: None Help needed moving from lying on your back to sitting on the side of a flat bed without using bedrails?: None Help needed moving to and from a bed to a chair (including a wheelchair)?: A Little Help needed standing up from a chair using your arms (e.g., wheelchair or bedside chair)?: A Little Help needed to walk in hospital room?: A Little Help needed climbing 3-5 steps with a railing? : A Little 6 Click Score: 20    End of Session   Activity Tolerance: Patient tolerated treatment well Patient left: in bed;with call bell/phone within reach Nurse Communication: Mobility status PT Visit Diagnosis: Other abnormalities of gait and mobility (R26.89)    Time: 6979-4801 PT Time Calculation (min) (ACUTE ONLY): 25 min   Charges:   PT Evaluation $PT Eval Low Complexity: 1 Low          Arlyss Gandy, PT, DPT Acute Rehabilitation Pager: [phone]   Arlyss Gandy 07/18/2020, 10:33 AM

## 2020-07-19 ENCOUNTER — Other Ambulatory Visit (HOSPITAL_COMMUNITY): Payer: Self-pay

## 2020-07-19 ENCOUNTER — Encounter (HOSPITAL_COMMUNITY): Payer: Self-pay | Admitting: Internal Medicine

## 2020-07-19 ENCOUNTER — Inpatient Hospital Stay (HOSPITAL_COMMUNITY): Payer: Medicare PPO

## 2020-07-19 LAB — BASIC METABOLIC PANEL
Anion gap: 11 (ref 5–15)
BUN: 19 mg/dL (ref 6–20)
CO2: 19 mmol/L — ABNORMAL LOW (ref 22–32)
Calcium: 8.8 mg/dL — ABNORMAL LOW (ref 8.9–10.3)
Chloride: 102 mmol/L (ref 98–111)
Creatinine, Ser: 1.35 mg/dL — ABNORMAL HIGH (ref 0.44–1.00)
GFR, Estimated: 45 mL/min — ABNORMAL LOW (ref >60)
Glucose, Bld: 229 mg/dL — ABNORMAL HIGH (ref 70–99)
Potassium: 4 mmol/L (ref 3.5–5.1)
Sodium: 132 mmol/L — ABNORMAL LOW (ref 135–145)

## 2020-07-19 LAB — CBC WITH DIFFERENTIAL/PLATELET
Abs Immature Granulocytes: 0.03 10*3/uL (ref 0.00–0.07)
Basophils Absolute: 0 10*3/uL (ref 0.0–0.1)
Basophils Relative: 0 %
Eosinophils Absolute: 0.1 10*3/uL (ref 0.0–0.5)
Eosinophils Relative: 2 %
HCT: 29.8 % — ABNORMAL LOW (ref 36.0–46.0)
Hemoglobin: 10.1 g/dL — ABNORMAL LOW (ref 12.0–15.0)
Immature Granulocytes: 1 %
Lymphocytes Relative: 43 %
Lymphs Abs: 2.5 10*3/uL (ref 0.7–4.0)
MCH: 28.5 pg (ref 26.0–34.0)
MCHC: 33.9 g/dL (ref 30.0–36.0)
MCV: 83.9 fL (ref 80.0–100.0)
Monocytes Absolute: 0.4 10*3/uL (ref 0.1–1.0)
Monocytes Relative: 7 %
Neutro Abs: 2.7 10*3/uL (ref 1.7–7.7)
Neutrophils Relative %: 47 %
Platelets: 153 10*3/uL (ref 150–400)
RBC: 3.55 MIL/uL — ABNORMAL LOW (ref 3.87–5.11)
RDW: 11.4 % — ABNORMAL LOW (ref 11.5–15.5)
WBC: 5.8 10*3/uL (ref 4.0–10.5)
nRBC: 0 % (ref 0.0–0.2)

## 2020-07-19 LAB — GLUCOSE, CAPILLARY
Glucose-Capillary: 234 mg/dL — ABNORMAL HIGH (ref 70–99)
Glucose-Capillary: 298 mg/dL — ABNORMAL HIGH (ref 70–99)
Glucose-Capillary: 283 mg/dL — ABNORMAL HIGH (ref 70–99)
Glucose-Capillary: 290 mg/dL — ABNORMAL HIGH (ref 70–99)

## 2020-07-19 MED ORDER — INSULIN GLARGINE 100 UNIT/ML SOLOSTAR PEN
40.0000 [IU] | PEN_INJECTOR | Freq: Two times a day (BID) | SUBCUTANEOUS | 0 refills | Status: AC
  Filled 2020-07-19: qty 15, 19d supply, fill #0

## 2020-07-19 MED ORDER — ROSUVASTATIN CALCIUM 20 MG PO TABS
20.0000 mg | ORAL_TABLET | Freq: Every day | ORAL | 0 refills | Status: AC
  Filled 2020-07-19: qty 30, 30d supply, fill #0

## 2020-07-19 MED ORDER — INSULIN GLARGINE 100 UNIT/ML ~~LOC~~ SOLN
30.0000 [IU] | Freq: Two times a day (BID) | SUBCUTANEOUS | Status: DC
  Administered 2020-07-19: 30 [IU] via SUBCUTANEOUS
  Filled 2020-07-19 (×3): qty 0.3

## 2020-07-19 MED ORDER — PANTOPRAZOLE SODIUM 40 MG PO TBEC
40.0000 mg | DELAYED_RELEASE_TABLET | Freq: Every day | ORAL | Status: DC
  Administered 2020-07-19 – 2020-07-21 (×3): 40 mg via ORAL
  Filled 2020-07-19 (×3): qty 1

## 2020-07-19 MED ORDER — METOCLOPRAMIDE HCL 10 MG PO TABS
10.0000 mg | ORAL_TABLET | Freq: Three times a day (TID) | ORAL | 0 refills | Status: AC | PRN
  Filled 2020-07-19: qty 90, 30d supply, fill #0

## 2020-07-19 MED ORDER — INSULIN PEN NEEDLE 32G X 4 MM MISC
0 refills | Status: AC
  Filled 2020-07-19: qty 100, 30d supply, fill #0

## 2020-07-19 NOTE — Progress Notes (Signed)
PROGRESS NOTE    Beth MerinoDorothy Wilkinson  HYQ:[id number] DOB: 06/23/1960 DOA: 07/16/2020 PCP: Pcp, No    No chief complaint on file.   Brief Narrative:   Beth MerinoDorothy Wilkinson is a 60 y.o. female with medical history significant of diabetes mellitus type 2 and CVA who presents with complaints of vomiting.  She lives in [place], [place] and has been visiting a friend here in [place] for the last 3 weeks.  Over the last 3 days however she reports that she has had difficulty controlling her blood sugars.  She had ran out of some of her short acting insulin and she was unable to keep down some of her oral medications.  Reports associated symptoms of headache and nausea.  Emesis has been nonbloody in appearance. Her niece makes note the patient has issues with compliance with medications in the past and has previously gone missing for peers in time for which family does know where she has gone.  She previously has had a stroke previously in the past with complaints of slurred speech, but patient reports that those symptoms have resolved.   Subjective:  Reports ab pain has resolved  No pain after breakfast Had bm No vomiting She is aaox3 Reports weaker than normal, more on left side Reports she was so weak to get her insulin injection at home, she reports she still has enough insulin at home While yesterday she told me she run out all her meds yesterday    Assessment & Plan:   Principal Problem:   Hyperosmolar hyperglycemic state (HHS) (HCC) Active Problems:   AKI (acute kidney injury) (HCC)   Acute metabolic encephalopathy   Metabolic acidosis with increased anion gap and accumulation of organic acids   History of CVA (cerebrovascular accident)  DKA, acute metabolic encephalopathy, presents on admission --Confusion likely due to elevated blood glucose, CT of head no acute findings, UDS negative -I believe the patient presented with DKA due to elevated anion gap, elevated beta hydroxybutyrate, positive  urine ketones, serum creatinine was not checked -She was on insulin drip from admission to 7 /6 at 3 AM, beta hydroxybutyrate has normalized -Continue blood glucose control, adjust insulin   insulin-dependent type 2 diabetes, A1c 15.5 Suspect medication noncompliance Diabetes and diet education Reports seeing Dr Synetta Failichard Carpenter for diabetes management  Nausea vomiting -CT abdomen no acute findings -Could be from DKA , but also could be from  underlying gastroparesis exacerbation, noted to be on Reglan at home, not sure if she is compliant with Reglan -Continue PPI, as needed antiemetics   constipation, resolved  AKI on CKD 3B -BUN 60/creatinine 3 on presentation -Likely prerenal due to dehydration from hyper osmolar state and poor oral intake -She received lactated ringer initially, she continued to have poor oral intake, changed to normal saline, BUN 30 creatinine 1.35 today, likely close to baseline   Hyponatremia -Likely combination from pseudohyponatremia and poor oral intake -Continue to adjust insulin, change IV fluids from LR to normal saline -Corrected sodium 133  -Oral intake has improved, encourage oral intake  Hypokalemia/hypomagnesemia Replaced K and mag  Hypertension Home blood pressure medication Lopressor was held on presentation due to low borderline BP Received hydration ,BP has improved, resume Lopressor at lower dose with holding parameters   History of CVA Continue statin Reports left side weakness, will get mri brain    Body mass index is 24.85 kg/m.Marland Kitchen.     Unresulted Labs (From admission, onward)    None  DVT prophylaxis: heparin injection 5,000 Units Start: 07/16/20 2215   Code Status: Full Family Communication: Niece over the phone on 7/7, sister over the phone on 7/8 Disposition:   Status is: Inpatient  Dispo: The patient is from: Home              Anticipated d/c is to: Possible tomorrow              Anticipated d/c  date is: Will need bring medication to her prior to discharge                Consultants:  None  Procedures:  None  Antimicrobials:   Anti-infectives (From admission, onward)    None           Objective: Vitals:   07/18/20 1940 07/19/20 0156 07/19/20 0459 07/19/20 0732  BP: (!) 131/53  111/61   Pulse: 92  84   Resp: 13  12   Temp: 98.4 F (36.9 C)  98.2 F (36.8 C) 97.7 F (36.5 C)  TempSrc: Oral  Oral Oral  SpO2: 99%  100%   Weight:  65.7 kg    Height:        Intake/Output Summary (Last 24 hours) at 07/19/2020 1118 Last data filed at 07/19/2020 1040 Gross per 24 hour  Intake 3656.82 ml  Output 3750 ml  Net -93.18 ml   Filed Weights   07/17/20 0506 07/18/20 0014 07/19/20 0156  Weight: 63.5 kg 65.2 kg 65.7 kg    Examination:  General exam: calm, NAD,  no confusion, aaox3 Respiratory system: Clear to auscultation. Respiratory effort normal. Cardiovascular system: S1 & S2 heard, RRR. No JVD, no murmur, No pedal edema. Gastrointestinal system: Abdomen is nondistended, soft and nontender.  Normal bowel sounds heard. Central nervous system: Alert and oriented. No focal neurological deficits. Extremities: Symmetric 5 x 5 power. Skin: No rashes, lesions or ulcers Psychiatry: Judgement and insight appear normal. Mood & affect appropriate.  Speech is clear today    Data Reviewed: I have personally reviewed following labs and imaging studies  CBC: Recent Labs  Lab 07/16/20 0909 07/16/20 1238 07/17/20 0352 07/18/20 0356 07/19/20 0520  WBC 8.2  --  8.5 5.9 5.8  NEUTROABS  --   --   --  2.3 2.7  HGB 13.3 15.0 13.9 10.8* 10.1*  HCT 40.5 44.0 41.6 31.5* 29.8*  MCV 86.2  --  84.0 84.0 83.9  PLT 248  --  218 157 153    Basic Metabolic Panel: Recent Labs  Lab 07/16/20 1231 07/16/20 1238 07/17/20 0028 07/17/20 0352 07/17/20 1611 07/18/20 0356 07/19/20 0520  NA 116*   < > 126* 130* 126* 130* 132*  K 4.6   < > 3.1* 3.2* 4.1 3.4* 4.0  CL 74*   < > 90*  92* 90* 96* 102  CO2 21*   < > 25 25 25 25  19*  GLUCOSE 1,010*   < > 202* 188* 375* 203* 229*  BUN 57*   < > 42* 38* 30* 25* 19  CREATININE 3.00*   < > 1.65* 1.61* 1.52* 1.53* 1.35*  CALCIUM 9.4   < > 9.3 9.6 8.8* 8.5* 8.8*  MG 2.9*  --   --   --   --  1.9  --    < > = values in this interval not displayed.    GFR: Estimated Creatinine Clearance: 41.3 mL/min (A) (by C-G formula based on SCr of 1.35 mg/dL (H)).  Liver Function Tests: Recent  Labs  Lab 07/16/20 0909 07/18/20 0356  AST 19 21  ALT 13 12  ALKPHOS 67 49  BILITOT 1.6* 1.2  PROT 7.5 5.2*  ALBUMIN 3.8 2.8*    CBG: Recent Labs  Lab 07/18/20 0550 07/18/20 1200 07/18/20 1557 07/18/20 2052 07/19/20 0559  GLUCAP 210* 270* 212* 162* 234*     Recent Results (from the past 240 hour(s))  Resp Panel by RT-PCR (Flu A&B, Covid) Nasopharyngeal Swab     Status: None   Collection Time: 07/16/20 12:27 PM   Specimen: Nasopharyngeal Swab; Nasopharyngeal(NP) swabs in vial transport medium  Result Value Ref Range Status   SARS Coronavirus 2 by RT PCR NEGATIVE NEGATIVE Final    Comment: (NOTE) SARS-CoV-2 target nucleic acids are NOT DETECTED.  The SARS-CoV-2 RNA is generally detectable in upper respiratory specimens during the acute phase of infection. The lowest concentration of SARS-CoV-2 viral copies this assay can detect is 138 copies/mL. A negative result does not preclude SARS-Cov-2 infection and should not be used as the sole basis for treatment or other patient management decisions. A negative result may occur with  improper specimen collection/handling, submission of specimen other than nasopharyngeal swab, presence of viral mutation(s) within the areas targeted by this assay, and inadequate number of viral copies(<138 copies/mL). A negative result must be combined with clinical observations, patient history, and epidemiological information. The expected result is Negative.  Fact Sheet for Patients:   BloggerCourse.com  Fact Sheet for Healthcare Providers:  SeriousBroker.it  This test is no t yet approved or cleared by the [place] FDA and  has been authorized for detection and/or diagnosis of SARS-CoV-2 by FDA under an Emergency Use Authorization (EUA). This EUA will remain  in effect (meaning this test can be used) for the duration of the COVID-19 declaration under Section 564(b)(1) of the Act, 21 U.S.C.section 360bbb-3(b)(1), unless the authorization is terminated  or revoked sooner.       Influenza A by PCR NEGATIVE NEGATIVE Final   Influenza B by PCR NEGATIVE NEGATIVE Final    Comment: (NOTE) The Xpert Xpress SARS-CoV-2/FLU/RSV plus assay is intended as an aid in the diagnosis of influenza from Nasopharyngeal swab specimens and should not be used as a sole basis for treatment. Nasal washings and aspirates are unacceptable for Xpert Xpress SARS-CoV-2/FLU/RSV testing.  Fact Sheet for Patients: BloggerCourse.com  Fact Sheet for Healthcare Providers: SeriousBroker.it  This test is not yet approved or cleared by the [place] FDA and has been authorized for detection and/or diagnosis of SARS-CoV-2 by FDA under an Emergency Use Authorization (EUA). This EUA will remain in effect (meaning this test can be used) for the duration of the COVID-19 declaration under Section 564(b)(1) of the Act, 21 U.S.C. section 360bbb-3(b)(1), unless the authorization is terminated or revoked.  Performed at [facility], [address], [place], [place] [address]          Radiology Studies: No results found.      Scheduled Meds:  dicyclomine  10 mg Oral QID   fluticasone  1 spray Each Nare Daily   heparin  5,000 Units Subcutaneous Q8H   hydrOXYzine  25-50 mg Oral QHS   insulin aspart  0-15 Units Subcutaneous TID WC   insulin aspart  0-5 Units Subcutaneous QHS    insulin aspart  3 Units Subcutaneous TID WC   insulin glargine  20 Units Subcutaneous BID   metoprolol tartrate  12.5 mg Oral BID   montelukast  10 mg Oral QHS  pantoprazole (PROTONIX) IV  40 mg Intravenous Q24H   polyethylene glycol  17 g Oral BID   rosuvastatin  20 mg Oral QHS   senna-docusate  1 tablet Oral BID   sodium chloride flush  3 mL Intravenous Q12H   topiramate  100 mg Oral BID   Continuous Infusions:     LOS: 3 days   Time spent: Greater than 50% of this time was spent in counseling, explanation of diagnosis, planning of further management, and coordination of care.   Voice Recognition Reubin Milan dictation system was used to create this note, attempts have been made to correct errors. Please contact the author with questions and/or clarifications.   Albertine Grates, MD PhD FACP [facility]  Available via Epic secure chat 7am-7pm for nonurgent issues Please page for urgent issues To page the attending provider between 7A-7P or the covering provider during after hours 7P-7A, please log into the web site [email or web address] and access using universal [place] password for that web site. If you do not have the password, please call the hospital operator.    07/19/2020, 11:18 AM

## 2020-07-19 NOTE — TOC Progression Note (Addendum)
Transition of Care [facility]) - Progression Note    Patient Details  Name: Solara Goodchild MRN: [id number] Date of Birth: [birth year]  [facility] CM/SW Contact  Leone Haven, RN Phone Number: 07/19/2020, 2:21 PM  Clinical Narrative:    NCM spoke with patient at bedside, she will need a rolling walker, she will need it to be delivered to [address] [place] [address].  [employer] will deliver the walker to her home. She will need a script for oupatient physical therapy as well, will notify the MD.  Gave script to patient,  NCM informed patient to take outpatient physical therapy script to [facility] [address] [place] [phone].  Plan for discharge tomorrow, TOC filled her meds and they sent them to main pharmacy. Staff RN will need to get meds from main pharmacy tomorrow. Patient has a PCP in [place].        Expected Discharge Plan and Services                                                 Social Determinants of Health (SDOH) Interventions    Readmission Risk Interventions No flowsheet data found.

## 2020-07-20 LAB — BASIC METABOLIC PANEL
Anion gap: 5 (ref 5–15)
BUN: 20 mg/dL (ref 6–20)
CO2: 22 mmol/L (ref 22–32)
Calcium: 8.9 mg/dL (ref 8.9–10.3)
Chloride: 105 mmol/L (ref 98–111)
Creatinine, Ser: 1.48 mg/dL — ABNORMAL HIGH (ref 0.44–1.00)
GFR, Estimated: 40 mL/min — ABNORMAL LOW (ref >60)
Glucose, Bld: 361 mg/dL — ABNORMAL HIGH (ref 70–99)
Potassium: 4.7 mmol/L (ref 3.5–5.1)
Sodium: 132 mmol/L — ABNORMAL LOW (ref 135–145)

## 2020-07-20 LAB — GLUCOSE, CAPILLARY
Glucose-Capillary: 285 mg/dL — ABNORMAL HIGH (ref 70–99)
Glucose-Capillary: 333 mg/dL — ABNORMAL HIGH (ref 70–99)
Glucose-Capillary: 233 mg/dL — ABNORMAL HIGH (ref 70–99)
Glucose-Capillary: 255 mg/dL — ABNORMAL HIGH (ref 70–99)

## 2020-07-20 LAB — MAGNESIUM: Magnesium: 1.7 mg/dL (ref 1.7–2.4)

## 2020-07-20 MED ORDER — LOSARTAN POTASSIUM-HCTZ 100-25 MG PO TABS: 1.0000 | ORAL_TABLET | Freq: Every day | ORAL | Status: AC

## 2020-07-20 MED ORDER — INSULIN GLARGINE 100 UNIT/ML ~~LOC~~ SOLN
45.0000 [IU] | Freq: Two times a day (BID) | SUBCUTANEOUS | Status: DC
  Administered 2020-07-20 – 2020-07-21 (×2): 45 [IU] via SUBCUTANEOUS
  Filled 2020-07-20 (×3): qty 0.45

## 2020-07-20 MED ORDER — MAGNESIUM SULFATE 2 GM/50ML IV SOLN
2.0000 g | Freq: Once | INTRAVENOUS | Status: AC
  Administered 2020-07-20: 2 g via INTRAVENOUS
  Filled 2020-07-20: qty 50

## 2020-07-20 MED ORDER — INSULIN ASPART 100 UNIT/ML IJ SOLN
5.0000 [IU] | Freq: Three times a day (TID) | INTRAMUSCULAR | Status: DC
  Administered 2020-07-21: 5 [IU] via SUBCUTANEOUS

## 2020-07-20 MED ORDER — INSULIN GLARGINE 100 UNIT/ML ~~LOC~~ SOLN
40.0000 [IU] | Freq: Two times a day (BID) | SUBCUTANEOUS | Status: DC
  Administered 2020-07-20: 40 [IU] via SUBCUTANEOUS
  Filled 2020-07-20 (×2): qty 0.4

## 2020-07-20 NOTE — Progress Notes (Signed)
Pt discharged, medications was given to pt.  Tried to call Barrett Gourdine (pts friend) 3x but no answer. Pt tried to call the same still no answer. MD made aware.

## 2020-07-20 NOTE — Discharge Summary (Signed)
Discharge Summary  Beth Wilkinson IRS:[id number] DOB: 03-Nov-1960  PCP: Pcp, No  Admit date: 07/16/2020 Discharge date: 07/20/2020  Time spent: , > 50% of time spent on patient education and coordination of care  Recommendations for Outpatient Follow-up:  F/u with PCP within a week  for hospital discharge follow up, repeat cbc/bmp at follow up Outpatient PT referral made   Discharge Diagnoses:  Active Hospital Problems   Diagnosis Date Noted   Hyperosmolar hyperglycemic state (HHS) (HCC) 07/16/2020   AKI (acute kidney injury) (HCC) 07/16/2020   Acute metabolic encephalopathy 07/16/2020   Metabolic acidosis with increased anion gap and accumulation of organic acids 07/16/2020   History of CVA (cerebrovascular accident) 07/16/2020    Resolved Hospital Problems  No resolved problems to display.    Discharge Condition: stable  Diet recommendation: heart healthy/carb modified  Filed Weights   07/18/20 0014 07/19/20 0156 07/20/20 0448  Weight: 65.2 kg 65.7 kg 64.8 kg    History of present illness: ( per admitting  HPI: Beth Wilkinson is a 60 y.o. female with medical history significant of diabetes mellitus type 2 and CVA who presents with complaints of vomiting.  She lives in [place], [place] and has been visiting a friend here in [place] for the last 3 weeks.  Over the last 3 days however she reports that she has had difficulty controlling her blood sugars.  She had ran out of some of her short acting insulin and she was unable to keep down some of her oral medications.  Reports associated symptoms of headache and nausea.  Emesis has been nonbloody in appearance. Her niece makes note the patient has issues with compliance with medications in the past and has previously gone missing for peers in time for which family does know where she has gone.  She previously has had a stroke previously in the past with complaints of slurred speech, but patient reports that those symptoms have  resolved.   ED Course: Upon admission into the emergency department patient was seen to be afebrile, pulse 92-1 06, blood pressure 78/51-130 2/86 after IV fluids, and all other vital signs maintained.  Chest x-ray showed no acute abnormalities.   Labs initially significant for sodium 120, chloride 79, CO2 21, glucose 901, BUN 60, creatinine 2.87, glucose 901, anion gap 20, lipase 132, and total bilirubin 1.6.  CT scan of the brain, abdomen, and pelvis did not note any acute abnormalities repeat labs revealed sodium 116, CO2 21, BUN 57, creatinine 3,  glucose 1010, and anion gap 21.  Patient had been started on insulin drip per protocol with bolus of lactated Ringer's, given 20 mg of potassium chloride IV, Zofran, and Reglan 10 mg IV.  TR[facility] called to admit.  Hospital Course:  Principal Problem:   Hyperosmolar hyperglycemic state (HHS) (HCC) Active Problems:   AKI (acute kidney injury) (HCC)   Acute metabolic encephalopathy   Metabolic acidosis with increased anion gap and accumulation of organic acids   History of CVA (cerebrovascular accident)  DKA, acute metabolic encephalopathy, presents on admission --Confusion likely due to elevated blood glucose, CT of head no acute findings, UDS negative -I believe the patient presented with DKA due to elevated anion gap, elevated beta hydroxybutyrate, positive urine ketones, serum creatinine was not checked -She was on insulin drip from admission to 7 /6 at 3 AM, beta hydroxybutyrate has normalized -Confusion resolved , she is now aaox3     insulin-dependent type 2 diabetes, A1c 15.5 Suspect medication noncompliance Diabetes and diet  education Reports seeing Dr Synetta Fail for diabetes management Insulin brought to her through " meds to bed" program from [facility]     Nausea /vomiting -CT abdomen no acute findings -Could be from DKA , but also could be from  underlying gastroparesis exacerbation, noted to be on Reglan  at home, not sure if she is compliant with Reglan -Continue PPI, as needed antiemetics -resolved     constipation, resolved   AKI on CKD 3B -BUN 60/creatinine 3 on presentation -Likely prerenal due to dehydration from hyper osmolar state and poor oral intake -She received lactated ringer initially then ns - BUN normalized and  creatinine  down to1.35,  likely close to baseline     Hyponatremia -Likely combination from pseudohyponatremia and poor oral intake -Continue to adjust insulin, change IV fluids from LR to normal saline -Corrected sodium 133 -Oral intake has improved, encourage oral intake   Hypokalemia/hypomagnesemia Replaced K and mag   Hypertension Home blood pressure medication Lopressor was held on presentation due to low borderline BP Received hydration , BP has improved, resume Lopressor  Careers adviser, f/u with pcp to decide on resumption      History of CVA Continue statin  mri brain obtained here, no acute cva      Body mass index is 24.85 kg/m.Marland Kitchen      Procedures: none  Consultations: Social Investment banker, operational   Discharge Exam: BP (!) 101/54 (BP Location: Right Arm)   Pulse 85   Temp 98 F (36.7 C) (Oral)   Resp 18   Ht  (1.626 m)   Wt 64.8 kg Comment: scale b  SpO2 100%   BMI 24.53 kg/m   General: NAD, AAOx3 Cardiovascular: RRR Respiratory: CTABL  Discharge Instructions You were cared for by a hospitalist during your hospital stay. If you have any questions about your discharge medications or the care you received while you were in the hospital after you are discharged, you can call the unit and asked to speak with the hospitalist on call if the hospitalist that took care of you is not available. Once you are discharged, your primary care physician will handle any further medical issues. Please note that NO REFILLS for any discharge medications will be authorized once you are discharged, as it is imperative that you return to  your primary care physician (or establish a relationship with a primary care physician if you do not have one) for your aftercare needs so that they can reassess your need for medications and monitor your lab values.  Discharge Instructions     Diet - low sodium heart healthy   Complete by: As directed    Carb modified diet   Discharge instructions   Complete by: As directed    Please do not drive until released by your primary care doctor   Increase activity slowly   Complete by: As directed       Allergies as of 07/20/2020       Reactions   Codeine Other (See Comments)   hallucinations   Penicillins Other (See Comments)   Pregabalin         Medication List     STOP taking these medications    Lantus 100 UNIT/ML injection Generic drug: insulin glargine Replaced by: Lantus SoloStar 100 UNIT/ML Solostar Pen       TAKE these medications    dicyclomine 10 MG capsule Commonly known as: BENTYL Take 10 mg by mouth 4 (four) times daily.  fluticasone 50 MCG/ACT nasal spray Commonly known as: FLONASE Place 1 spray into both nostrils daily.   gabapentin 100 MG capsule Commonly known as: NEURONTIN Take 100 mg by mouth 3 (three) times daily.   glimepiride 2 MG tablet Commonly known as: AMARYL Take 2 mg by mouth every morning.   hydrOXYzine 25 MG capsule Commonly known as: VISTARIL Take 25-50 mg by mouth at bedtime.   Insulin Lispro Prot & Lispro (75-25) 100 UNIT/ML Kwikpen Commonly known as: HUMALOG 75/25 MIX Inject 15 Units into the skin daily.   Januvia 100 MG tablet Generic drug: sitaGLIPtin Take 100 mg by mouth daily.   Lantus SoloStar 100 UNIT/ML Solostar Pen Generic drug: insulin glargine Inject 40 Units into the skin 2 (two) times daily. Replaces: Lantus 100 UNIT/ML injection   losartan-hydrochlorothiazide 100-25 MG tablet Commonly known as: HYZAAR Take 1 tablet by mouth daily. Please hold this medication, please see your primary care doctor next  week, your primary care doctor to decide on when to resume this medication What changed: additional instructions   metoCLOPramide 10 MG tablet Commonly known as: REGLAN Take 1 tablet (10 mg total) by mouth 3 (three) times daily as needed for nausea or vomiting.   metoprolol tartrate 25 MG tablet Commonly known as: LOPRESSOR Take 25 mg by mouth 2 (two) times daily.   mirtazapine 30 MG disintegrating tablet Commonly known as: REMERON SOL-TAB Take 30 mg by mouth at bedtime.   montelukast 10 MG tablet Commonly known as: SINGULAIR Take 10 mg by mouth at bedtime.   nitroGLYCERIN 0.4 MG SL tablet Commonly known as: NITROSTAT Place 0.4 mg under the tongue every 5 (five) minutes as needed for chest pain.   omeprazole 40 MG capsule Commonly known as: PRILOSEC Take 40 mg by mouth daily.   Pentips 32G X 4 MM Misc Generic drug: Insulin Pen Needle Use as directed with insulin pens   pioglitazone 30 MG tablet Commonly known as: ACTOS Take 30 mg by mouth daily.   ProAir RespiClick 108 (90 Base) MCG/ACT Aepb Generic drug: Albuterol Sulfate Inhale 2 puffs into the lungs every 6 (six) hours as needed for wheezing or shortness of breath.   rosuvastatin 20 MG tablet Commonly known as: CRESTOR Take 1 tablet (20 mg total) by mouth at bedtime.   topiramate 100 MG tablet Commonly known as: TOPAMAX Take 100 mg by mouth 2 (two) times daily.               Durable Medical Equipment  (From admission, onward)           Start     Ordered   07/20/20 1045  For home use only DME Walker rolling  Once       Question Answer Comment  Walker: With 5 Inch Wheels   Patient needs a walker to treat with the following condition FTT (failure to thrive) in adult      07/20/20 1045   07/19/20 1454  For home use only DME Walker rolling  Once       Question Answer Comment  Walker: With 5 Inch Wheels   Patient needs a walker to treat with the following condition Weakness      07/19/20 1453            Allergies  Allergen Reactions   Codeine Other (See Comments)    hallucinations   Penicillins Other (See Comments)   Pregabalin     Follow-up Information     [facility] Follow up.  Why: rolling walker Contact information: [address] [place] [id number] [phone]         [facility] Follow up.   Why: please call to schedule you an apt for outpatient physical therapy and take the script with you. Contact information: [address] [place] [id number] [phone]        f/u with your pcp dr Synetta Failichard Carpenter Follow up in 1 week(s).   Why: for hospital discharge follow up, repeat basic lab works including cbc/bmp at follow up. please bring in your blood sugar recording to your pcp , please continue work with your pcp for diabetes control, goal of a1c is less than 7%, your a1c currently is > 15.5%                 The results of significant diagnostics from this hospitalization (including imaging, microbiology, ancillary and laboratory) are listed below for reference.    Significant Diagnostic Studies: CT Abdomen Pelvis Wo Contrast  Result Date: 07/16/2020 CLINICAL DATA:  Midline abdominal pain and nausea free sugar blood glucose greater than 1,000 EXAM: CT ABDOMEN AND PELVIS WITHOUT CONTRAST TECHNIQUE: Multidetector CT imaging of the abdomen and pelvis was performed following the standard protocol without IV contrast. COMPARISON:  None. FINDINGS: Lower chest: Hypoventilatory change in the dependent lungs. No acute abnormality. Hepatobiliary: Unremarkable noncontrast appearance of the hepatic parenchyma. Gallbladder is unremarkable. No biliary ductal dilation. Pancreas: The pancreatic parenchyma is within normal noncontrast appearance. Spleen: Within normal limits. Adrenals/Urinary Tract: Thickening of the bilateral adrenal glands without discrete nodularity, favored represent hyperplasia. No hydronephrosis. No renal, ureteral  or bladder calculi visualized. Homogeneously hypodense 3 cm right lower pole renal lesion with Hounsfield units approaching water density, likely representing a cyst. Additional right upper pole hypodense renal lesion which is too small to accurately characterize but statistically likely to represent a cyst. Urinary bladder is unremarkable. Stomach/Bowel: Stomach is unremarkable for degree of distension. No pathologic dilation of small bowel. The appendix and terminal ileum are grossly unremarkable. Hyperdense material throughout the ascending and transverse colon, likely representing ingested material such as anti diuretic. No evidence of acute colonic inflammation. Vascular/Lymphatic: Aortic atherosclerosis without aneurysmal dilation. No pathologically enlarged abdominal or pelvic lymph nodes. Reproductive: Uterus and bilateral adnexa are unremarkable. Other: No abdominopelvic ascites. Musculoskeletal: Multilevel degenerative changes spine. No aggressive lytic or blastic lesion of bone. IMPRESSION: 1. No acute findings in the abdomen or pelvis. 2.  Aortic Atherosclerosis (ICD10-I70.0). Electronically Signed   By: Maudry MayhewJeffrey  Waltz MD   On: 07/16/2020 14:39   CT Head Wo Contrast  Result Date: 07/16/2020 CLINICAL DATA:  Left frontal headache. EXAM: CT HEAD WITHOUT CONTRAST TECHNIQUE: Contiguous axial images were obtained from the base of the skull through the vertex without intravenous contrast. COMPARISON:  None. FINDINGS: Brain: No evidence of acute infarction, hemorrhage, hydrocephalus, extra-axial collection or mass lesion/mass effect. Mild generalized cerebral atrophy. Scattered mild subcortical white matter hypodensities are nonspecific, but favored to reflect chronic microvascular ischemic changes. Vascular: Atherosclerotic vascular calcification of the carotid siphons. No hyperdense vessel. Skull: Normal. Negative for fracture or focal lesion. Sinuses/Orbits: No acute finding. Other: None. IMPRESSION: 1. No  acute intracranial abnormality. 2. Mild cerebral atrophy and chronic microvascular ischemic changes. Electronically Signed   By: Obie DredgeWilliam T Derry M.D.   On: 07/16/2020 14:33   MR BRAIN WO CONTRAST  Result Date: 07/19/2020 CLINICAL DATA:  Stroke suspected. EXAM: MRI HEAD WITHOUT CONTRAST TECHNIQUE: Multiplanar, multiecho pulse sequences of the brain and surrounding structures  were obtained without intravenous contrast. COMPARISON:  CT head July 16, 2020. FINDINGS: Brain: No acute infarction, hemorrhage, hydrocephalus, extra-axial collection or mass lesion. Moderate scattered T2/FLAIR hyperintensities within the white matter, nonspecific but most likely related to chronic microvascular ischemic disease. Vascular: Major arterial flow voids are maintained at the skull base. Skull and upper cervical spine: Normal marrow signal. Sinuses/Orbits: Clear sinuses.  Borderline bilateral exophthalmos. Other: Small bilateral mastoid effusions. IMPRESSION: 1. No evidence of acute intracranial abnormality.  No acute infarct. 2. Moderate chronic microvascular ischemic disease. Electronically Signed   By: Feliberto Harts MD   On: 07/19/2020 14:07   DG Chest Portable 1 View  Result Date: 07/16/2020 CLINICAL DATA:  Onset vomiting last night. EXAM: PORTABLE CHEST 1 VIEW COMPARISON:  None. FINDINGS: Lungs clear. Heart size normal. No pneumothorax or pleural fluid. No acute or focal bony abnormality. IMPRESSION: Negative chest. Electronically Signed   By: Drusilla Kanner M.D.   On: 07/16/2020 12:28    Microbiology: Recent Results (from the past 240 hour(s))  Resp Panel by RT-PCR (Flu A&B, Covid) Nasopharyngeal Swab     Status: None   Collection Time: 07/16/20 12:27 PM   Specimen: Nasopharyngeal Swab; Nasopharyngeal(NP) swabs in vial transport medium  Result Value Ref Range Status   SARS Coronavirus 2 by RT PCR NEGATIVE NEGATIVE Final    Comment: (NOTE) SARS-CoV-2 target nucleic acids are NOT DETECTED.  The SARS-CoV-2  RNA is generally detectable in upper respiratory specimens during the acute phase of infection. The lowest concentration of SARS-CoV-2 viral copies this assay can detect is 138 copies/mL. A negative result does not preclude SARS-Cov-2 infection and should not be used as the sole basis for treatment or other patient management decisions. A negative result may occur with  improper specimen collection/handling, submission of specimen other than nasopharyngeal swab, presence of viral mutation(s) within the areas targeted by this assay, and inadequate number of viral copies(<138 copies/mL). A negative result must be combined with clinical observations, patient history, and epidemiological information. The expected result is Negative.  Fact Sheet for Patients:  BloggerCourse.com  Fact Sheet for Healthcare Providers:  SeriousBroker.it  This test is no t yet approved or cleared by the [place] FDA and  has been authorized for detection and/or diagnosis of SARS-CoV-2 by FDA under an Emergency Use Authorization (EUA). This EUA will remain  in effect (meaning this test can be used) for the duration of the COVID-19 declaration under Section 564(b)(1) of the Act, 21 U.S.C.section 360bbb-3(b)(1), unless the authorization is terminated  or revoked sooner.       Influenza A by PCR NEGATIVE NEGATIVE Final   Influenza B by PCR NEGATIVE NEGATIVE Final    Comment: (NOTE) The Xpert Xpress SARS-CoV-2/FLU/RSV plus assay is intended as an aid in the diagnosis of influenza from Nasopharyngeal swab specimens and should not be used as a sole basis for treatment. Nasal washings and aspirates are unacceptable for Xpert Xpress SARS-CoV-2/FLU/RSV testing.  Fact Sheet for Patients: BloggerCourse.com  Fact Sheet for Healthcare Providers: SeriousBroker.it  This test is not yet approved or cleared by the  [place] FDA and has been authorized for detection and/or diagnosis of SARS-CoV-2 by FDA under an Emergency Use Authorization (EUA). This EUA will remain in effect (meaning this test can be used) for the duration of the COVID-19 declaration under Section 564(b)(1) of the Act, 21 U.S.C. section 360bbb-3(b)(1), unless the authorization is terminated or revoked.  Performed at [facility], [address], [place], [place]  [address]      Labs: Basic Metabolic Panel: Recent Labs  Lab 07/16/20 1231 07/16/20 1238 07/17/20 0352 07/17/20 1611 07/18/20 0356 07/19/20 0520 07/20/20 0129  NA 116*   < > 130* 126* 130* 132* 132*  K 4.6   < > 3.2* 4.1 3.4* 4.0 4.7  CL 74*   < > 92* 90* 96* 102 105  CO2 21*   < > 25 25 25  19* 22  GLUCOSE 1,010*   < > 188* 375* 203* 229* 361*  BUN 57*   < > 38* 30* 25* 19 20  CREATININE 3.00*   < > 1.61* 1.52* 1.53* 1.35* 1.48*  CALCIUM 9.4   < > 9.6 8.8* 8.5* 8.8* 8.9  MG 2.9*  --   --   --  1.9  --  1.7   < > = values in this interval not displayed.   Liver Function Tests: Recent Labs  Lab 07/16/20 0909 07/18/20 0356  AST 19 21  ALT 13 12  ALKPHOS 67 49  BILITOT 1.6* 1.2  PROT 7.5 5.2*  ALBUMIN 3.8 2.8*   Recent Labs  Lab 07/16/20 0909  LIPASE 132*   No results for input(s): AMMONIA in the last 168 hours. CBC: Recent Labs  Lab 07/16/20 0909 07/16/20 1238 07/17/20 0352 07/18/20 0356 07/19/20 0520  WBC 8.2  --  8.5 5.9 5.8  NEUTROABS  --   --   --  2.3 2.7  HGB 13.3 15.0 13.9 10.8* 10.1*  HCT 40.5 44.0 41.6 31.5* 29.8*  MCV 86.2  --  84.0 84.0 83.9  PLT 248  --  218 157 153   Cardiac Enzymes: No results for input(s): CKTOTAL, CKMB, CKMBINDEX, TROPONINI in the last 168 hours. BNP: BNP (last 3 results) No results for input(s): BNP in the last 8760 hours.  ProBNP (last 3 results) No results for input(s): PROBNP in the last 8760 hours.  CBG: Recent Labs  Lab 07/19/20 0559 07/19/20 1137 07/19/20 1609  07/19/20 2107 07/20/20 0611  GLUCAP 234* 298* 283* 290* 285*       Signed:  09/20/20 MD, PhD, FACP  [facility] 07/20/2020, 10:53 AM

## 2020-07-20 NOTE — Social Work (Addendum)
SW informed regarding difficulty locating pt friend for transportation at d/c.   SW attempted to call pt's friend Beth Wilkinson [phone]) straight to VM.   SW spoke with pt's niece Beth Wilkinson [phone]) she reports she does not know pt's friend nor how to contact him. Prior to trip to [facility], Pt called family and said she was traveling with Beth but gave no other information as to where they were staying. Beth Wilkinson will try to contact family to pick pt up, but all family is out of state ([place]) and likely would not be available until tomorrow.   SW informed of the following information regarding Beth: Address: [address] or [address] [place] Pt's car: [address] [place] plates, tag number and car model unknown to pt  SW relayed above information to [employer] non emergent [phone]), who report [address] address not registered in [place]. They plan to send out units when available to [address]   Update 330pm SW received call from [facility] deputy, reports arrived at address provided and made several attempts at knocking on door but no answer. He also reported pt car not in sight.   SW updated supervisor Steward Drone, RN regarding situation.   SW spoke with pt's niece Beth Wilkinson, again reports she is still trying to locate someone to assist with getting pt. She has also attempted to contact Beth as well but unsuccessful. Kim requested to look into if pt has wallet and card, if so she can get [employer] to [place], [place] and family will come get her from station.

## 2020-07-21 LAB — VITAMIN B12: Vitamin B-12: 1055 pg/mL — ABNORMAL HIGH (ref 180–914)

## 2020-07-21 LAB — GLUCOSE, CAPILLARY: Glucose-Capillary: 184 mg/dL — ABNORMAL HIGH (ref 70–99)

## 2020-07-21 LAB — TSH: TSH: 1.122 u[IU]/mL (ref 0.350–4.500)

## 2020-07-21 NOTE — Progress Notes (Signed)
Medications given to patient. Patients belongings with pt: purse, glasses, charger,cane and clothes. Pts friend Barrett came to pick up pt.   RN took pt to main entrance, there were two cars waiting for patient. Nurse asked pt if she felt safe and pt stated yes.    MD aware of pt's discharge.

## 2020-07-21 NOTE — Social Work (Signed)
   SW informed during progression, pt's friend Barrett was supposed to pick up pt at 8am today but has yet to arrive.   SW spoke with Barrett [phone]) who reports he is on his way and should arrive by 10am. Barrett requested pt be brought down to main entrance.   SW relayed information to bedside, RN and MD  SW updated pt's niece Selena Batten [phone]). Selena Batten will discuss with pt plans for returning home

## 2022-12-02 IMAGING — MR MR HEAD W/O CM
9 of 10 series · 37 of 48 positions shown · non-contrast
Comparison: CT head July 16, 2020.

CLINICAL DATA: Stroke suspected.

EXAM:
MRI HEAD WITHOUT CONTRAST
TECHNIQUE: Multiplanar, multiecho pulse sequences of the brain and surrounding
structures were obtained without intravenous contrast.

[Series 3: DWI · axial · 3.0mm · 1.09mm/px · z∈[-81,+53]mm · 8 of 94 slices shown (1 of 4)]
[im 1/94]
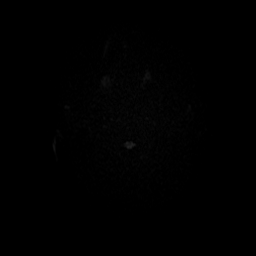
[im 11/94]
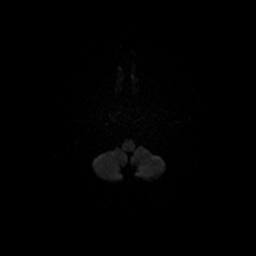
[im 32/94]
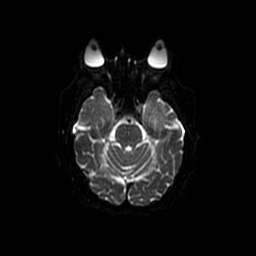
[im 42/94]
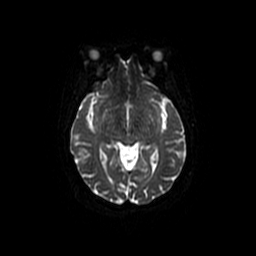
[im 52/94]
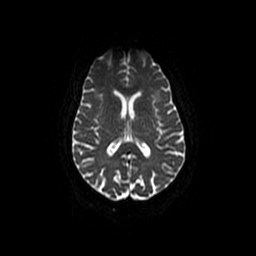
[im 63/94]
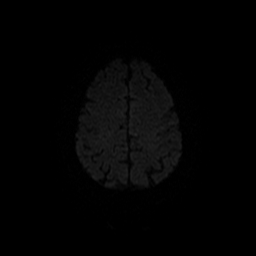
[im 83/94]
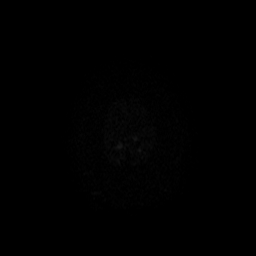
[im 94/94]
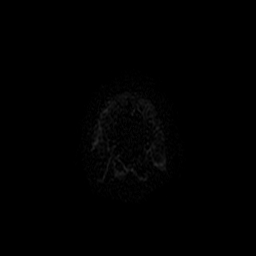

[Series 4: DWI · coronal · 5.0mm · 1.09mm/px · 8 of 73 slices shown (2 of 4)]
[im 1/73]
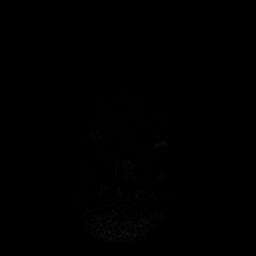
[im 11/73]
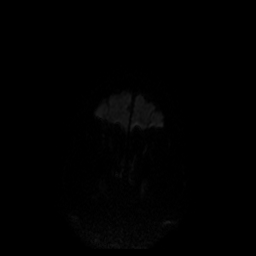
[im 21/73]
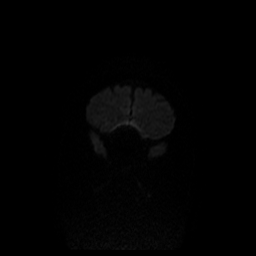
[im 31/73]
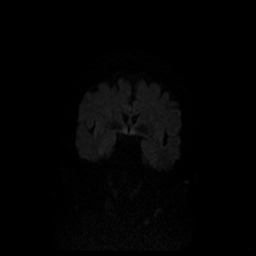
[im 42/73]
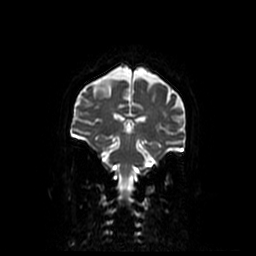
[im 52/73]
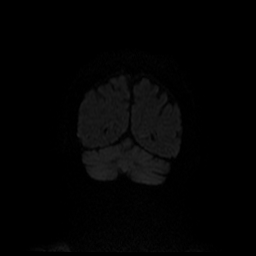
[im 62/73]
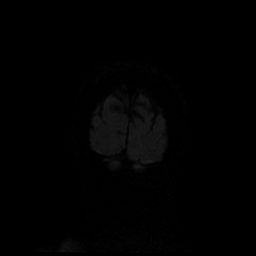
[im 73/73]
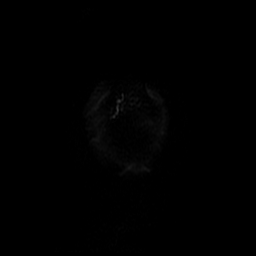

[Series 5: T1 · sagittal · 5.0mm · 0.47mm/px · 2 of 23 slices shown (1 of 2)]
[im 1/23]
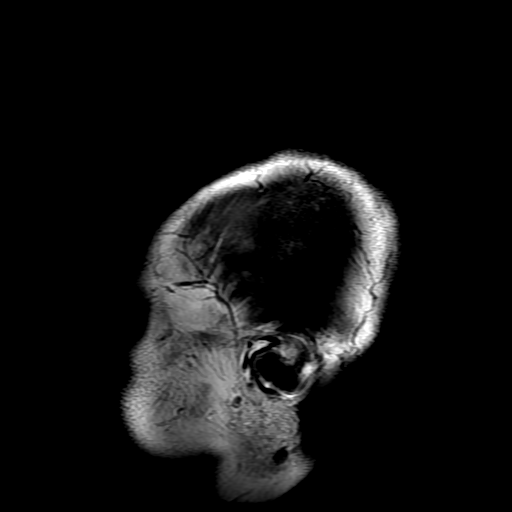
[im 23/23]
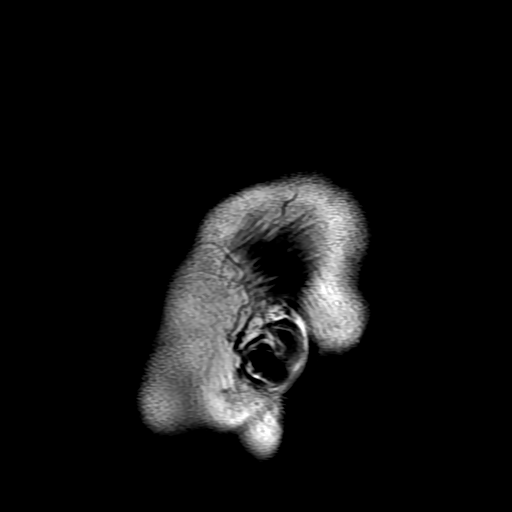

[Series 6: T2 · axial · 5.0mm · 0.43mm/px · z∈[-76,+58]mm · 2 of 24 slices shown (1 of 2)]
[im 1/24]
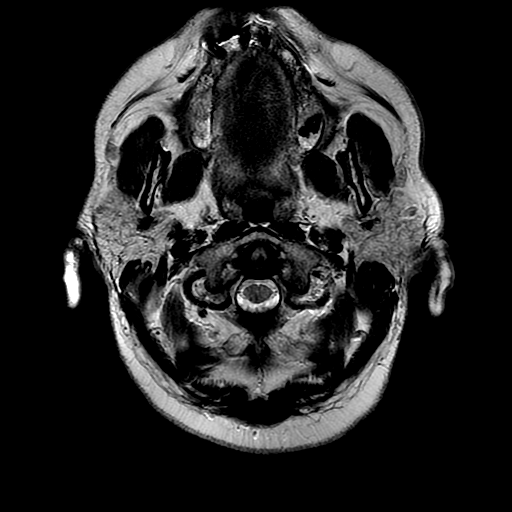
[im 24/24]
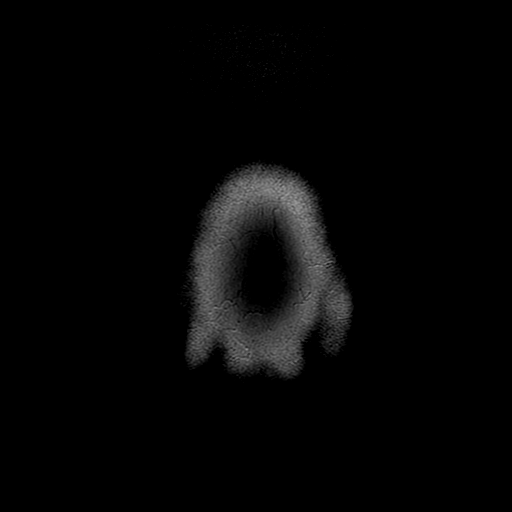

[Series 7: FLAIR · axial · 3.0mm · 0.43mm/px · z∈[-76,+58]mm · 2 of 24 slices shown]
[im 1/24]
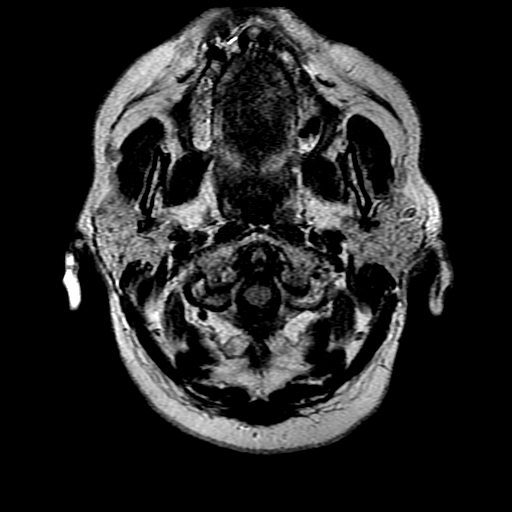
[im 24/24]
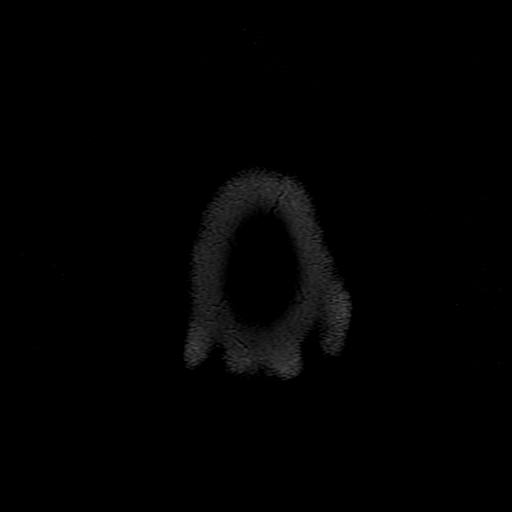

[Series 9: T1 · axial · 3.0mm · 0.47mm/px · z∈[-79,-34]mm · 3 of 96 slices shown (2 of 2)]
[im 1/96]
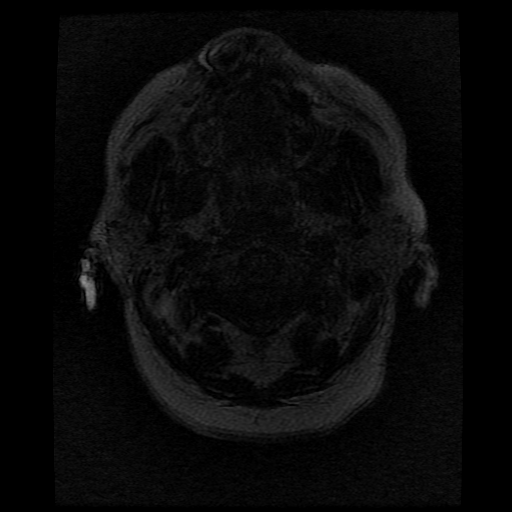
[im 11/96]
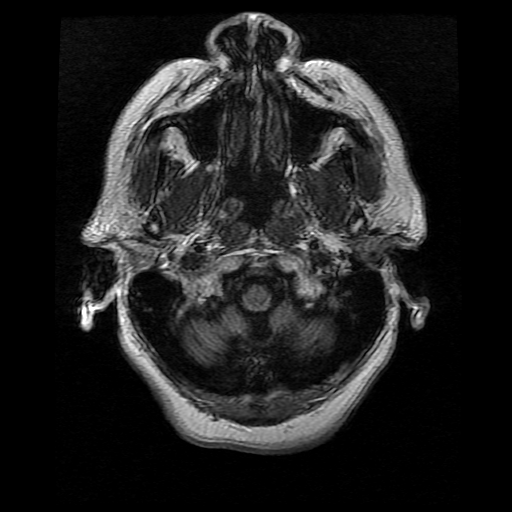
[im 32/96]
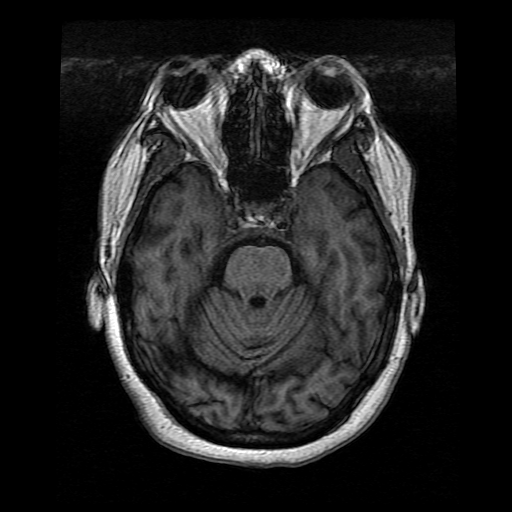

[Series 10: T2 · coronal · 5.0mm · 0.39mm/px · 3 of 28 slices shown (2 of 2)]
[im 1/28]
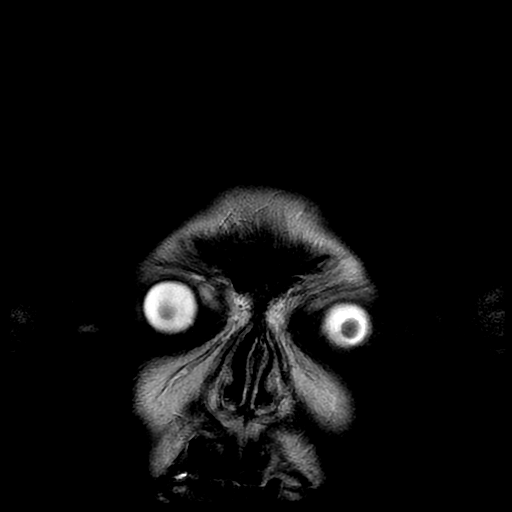
[im 14/28]
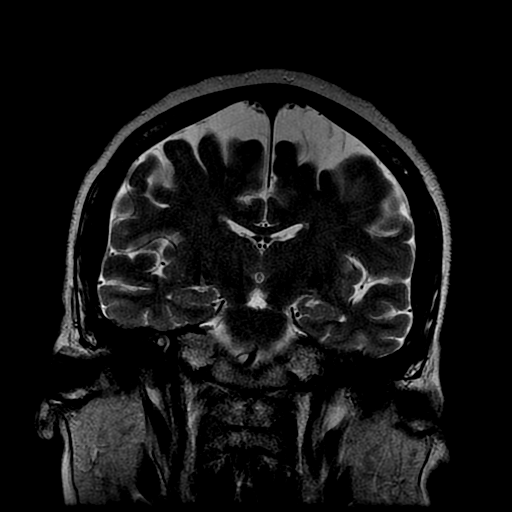
[im 28/28]
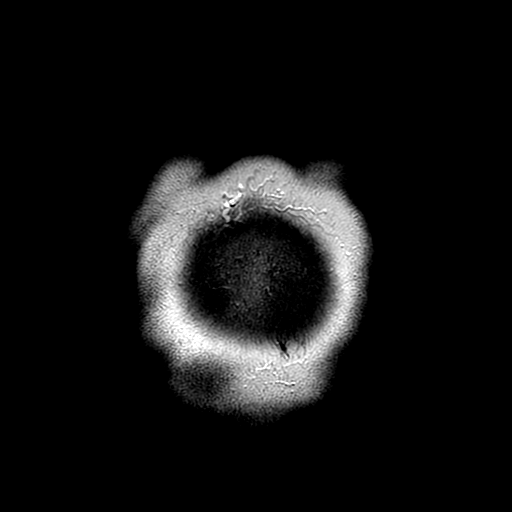

[Series 300: DWI · axial · 3.0mm · 1.09mm/px · z∈[-81,+53]mm · 5 of 47 slices shown (3 of 4)]
[im 1/47]
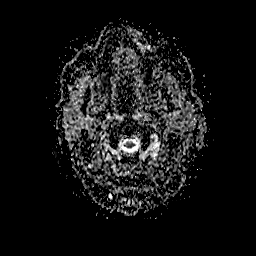
[im 12/47]
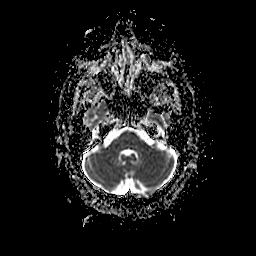
[im 24/47]
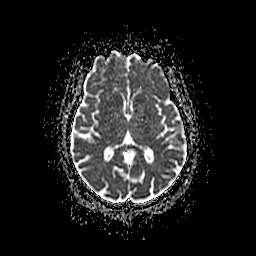
[im 35/47]
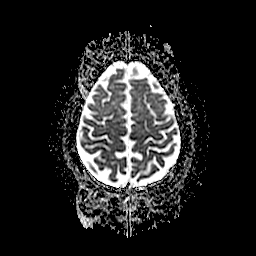
[im 47/47]
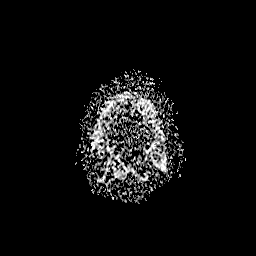

[Series 400: DWI · coronal · 5.0mm · 1.09mm/px · 4 of 37 slices shown (4 of 4)]
[im 1/37]
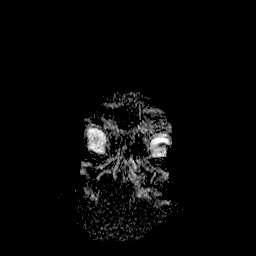
[im 13/37]
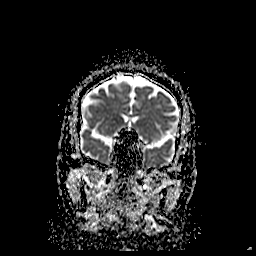
[im 25/37]
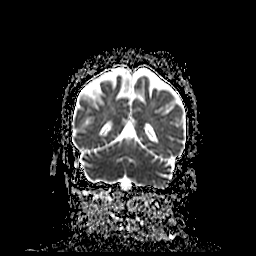
[im 37/37]
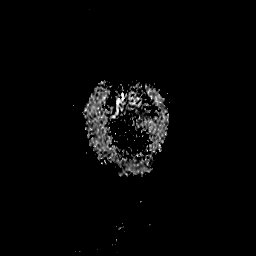

[37 of 48 positions shown; findings below may reference images not displayed]

FINDINGS: Brain: No acute infarction, hemorrhage, hydrocephalus, extra-axial
collection or mass lesion. Moderate scattered T2/FLAIR
hyperintensities within the white matter, nonspecific but most
likely related to chronic microvascular ischemic disease.

Vascular: Major arterial flow voids are maintained at the skull
base.

Skull and upper cervical spine: Normal marrow signal.

Sinuses/Orbits: Clear sinuses.  Borderline bilateral exophthalmos.

Other: Small bilateral mastoid effusions.
IMPRESSION: 1. No evidence of acute intracranial abnormality.  No acute infarct.
2. Moderate chronic microvascular ischemic disease.
